# Patient Record
Sex: Male | Born: 1955
Health system: Southern US, Community
[De-identification: ages and names within clinical notes are randomized; demographics above are authoritative.]

## PROBLEM LIST (undated history)

## (undated) DIAGNOSIS — E785 Hyperlipidemia, unspecified: Secondary | ICD-10-CM

## (undated) DIAGNOSIS — K922 Gastrointestinal hemorrhage, unspecified: Secondary | ICD-10-CM

## (undated) DIAGNOSIS — I214 Non-ST elevation (NSTEMI) myocardial infarction: Secondary | ICD-10-CM

## (undated) DIAGNOSIS — D126 Benign neoplasm of colon, unspecified: Secondary | ICD-10-CM

## (undated) DIAGNOSIS — I480 Paroxysmal atrial fibrillation: Secondary | ICD-10-CM

## (undated) DIAGNOSIS — K259 Gastric ulcer, unspecified as acute or chronic, without hemorrhage or perforation: Secondary | ICD-10-CM

## (undated) DIAGNOSIS — Z951 Presence of aortocoronary bypass graft: Secondary | ICD-10-CM

## (undated) DIAGNOSIS — K219 Gastro-esophageal reflux disease without esophagitis: Secondary | ICD-10-CM

## (undated) DIAGNOSIS — T39395A Adverse effect of other nonsteroidal anti-inflammatory drugs [NSAID], initial encounter: Secondary | ICD-10-CM

## (undated) DIAGNOSIS — K317 Polyp of stomach and duodenum: Secondary | ICD-10-CM

## (undated) HISTORY — DX: Adverse effect of other nonsteroidal anti-inflammatory drugs (NSAID), initial encounter: T39.395A

## (undated) HISTORY — DX: Hyperlipidemia, unspecified: E78.5

## (undated) HISTORY — DX: Presence of aortocoronary bypass graft: Z95.1

## (undated) HISTORY — DX: Non-ST elevation (NSTEMI) myocardial infarction: I21.4

## (undated) HISTORY — DX: Gastrointestinal hemorrhage, unspecified: K92.2

## (undated) HISTORY — DX: Polyp of stomach and duodenum: K31.7

## (undated) HISTORY — DX: Benign neoplasm of colon, unspecified: D12.6

## (undated) HISTORY — DX: Paroxysmal atrial fibrillation: I48.0

## (undated) HISTORY — DX: Gastric ulcer, unspecified as acute or chronic, without hemorrhage or perforation: K25.9

## (undated) HISTORY — DX: Gastro-esophageal reflux disease without esophagitis: K21.9

---

## 2008-10-03 ENCOUNTER — Ambulatory Visit: Payer: Self-pay | Admitting: Internal Medicine

## 2008-10-03 DIAGNOSIS — Z8711 Personal history of peptic ulcer disease: Secondary | ICD-10-CM

## 2008-10-03 DIAGNOSIS — E785 Hyperlipidemia, unspecified: Secondary | ICD-10-CM | POA: Insufficient documentation

## 2008-10-03 LAB — CONVERTED CEMR LAB
Albumin: 3.9 g/dL (ref 3.5–5.2)
Alkaline Phosphatase: 52 units/L (ref 39–117)
BUN: 11 mg/dL (ref 6–23)
Basophils Relative: 0.6 % (ref 0.0–3.0)
Eosinophils Relative: 14.8 % — ABNORMAL HIGH (ref 0.0–5.0)
GFR calc Af Amer: 115 mL/min
Glucose, Bld: 112 mg/dL — ABNORMAL HIGH (ref 70–99)
HCT: 44.2 % (ref 39.0–52.0)
Hemoglobin: 15.5 g/dL (ref 13.0–17.0)
Monocytes Absolute: 0.6 10*3/uL (ref 0.1–1.0)
Monocytes Relative: 9.2 % (ref 3.0–12.0)
Neutro Abs: 3.3 10*3/uL (ref 1.4–7.7)
Nitrite: NEGATIVE
Platelets: 154 10*3/uL (ref 150–400)
Potassium: 5 meq/L (ref 3.5–5.1)
RBC: 4.9 M/uL (ref 4.22–5.81)
Specific Gravity, Urine: 1.015 (ref 1.000–1.03)
TSH: 0.99 microintl units/mL (ref 0.35–5.50)
Total Protein, Urine: NEGATIVE mg/dL
Total Protein: 6.9 g/dL (ref 6.0–8.3)
WBC: 6.7 10*3/uL (ref 4.5–10.5)
pH: 6 (ref 5.0–8.0)

## 2008-10-12 ENCOUNTER — Ambulatory Visit: Payer: Self-pay

## 2008-10-12 ENCOUNTER — Encounter: Payer: Self-pay | Admitting: Internal Medicine

## 2008-10-20 ENCOUNTER — Encounter: Payer: Self-pay | Admitting: Internal Medicine

## 2009-01-18 ENCOUNTER — Ambulatory Visit: Payer: Self-pay | Admitting: Gastroenterology

## 2009-02-02 ENCOUNTER — Ambulatory Visit: Payer: Self-pay | Admitting: Gastroenterology

## 2009-02-02 ENCOUNTER — Encounter: Payer: Self-pay | Admitting: Gastroenterology

## 2009-02-02 ENCOUNTER — Encounter: Payer: Self-pay | Admitting: Internal Medicine

## 2009-02-05 ENCOUNTER — Encounter: Payer: Self-pay | Admitting: Gastroenterology

## 2013-12-16 ENCOUNTER — Encounter: Payer: Self-pay | Admitting: Gastroenterology

## 2014-08-19 ENCOUNTER — Encounter: Payer: Self-pay | Admitting: Gastroenterology

## 2014-08-23 ENCOUNTER — Telehealth: Payer: Self-pay | Admitting: Gastroenterology

## 2014-08-23 ENCOUNTER — Inpatient Hospital Stay (HOSPITAL_COMMUNITY)
Admission: EM | Admit: 2014-08-23 | Discharge: 2014-08-25 | DRG: 378 | Disposition: A | Discharge status: Home / Self Care | Payer: BC Managed Care – PPO | Attending: Internal Medicine | Admitting: Internal Medicine

## 2014-08-23 ENCOUNTER — Encounter (HOSPITAL_COMMUNITY): Payer: Self-pay | Admitting: Emergency Medicine

## 2014-08-23 ENCOUNTER — Emergency Department (HOSPITAL_COMMUNITY): Payer: BC Managed Care – PPO

## 2014-08-23 DIAGNOSIS — T39395A Adverse effect of other nonsteroidal anti-inflammatory drugs [NSAID], initial encounter: Secondary | ICD-10-CM | POA: Diagnosis present

## 2014-08-23 DIAGNOSIS — E785 Hyperlipidemia, unspecified: Secondary | ICD-10-CM | POA: Diagnosis present

## 2014-08-23 DIAGNOSIS — K2210 Ulcer of esophagus without bleeding: Secondary | ICD-10-CM | POA: Diagnosis present

## 2014-08-23 DIAGNOSIS — K317 Polyp of stomach and duodenum: Secondary | ICD-10-CM | POA: Diagnosis present

## 2014-08-23 DIAGNOSIS — D62 Acute posthemorrhagic anemia: Secondary | ICD-10-CM | POA: Diagnosis present

## 2014-08-23 DIAGNOSIS — K209 Esophagitis, unspecified without bleeding: Secondary | ICD-10-CM

## 2014-08-23 DIAGNOSIS — D72829 Elevated white blood cell count, unspecified: Secondary | ICD-10-CM | POA: Diagnosis present

## 2014-08-23 DIAGNOSIS — Z8711 Personal history of peptic ulcer disease: Secondary | ICD-10-CM

## 2014-08-23 DIAGNOSIS — K257 Chronic gastric ulcer without hemorrhage or perforation: Secondary | ICD-10-CM | POA: Diagnosis present

## 2014-08-23 DIAGNOSIS — Z98818 Other dental procedure status: Secondary | ICD-10-CM | POA: Diagnosis not present

## 2014-08-23 DIAGNOSIS — D500 Iron deficiency anemia secondary to blood loss (chronic): Secondary | ICD-10-CM

## 2014-08-23 DIAGNOSIS — T398X5A Adverse effect of other nonopioid analgesics and antipyretics, not elsewhere classified, initial encounter: Secondary | ICD-10-CM

## 2014-08-23 DIAGNOSIS — K260 Acute duodenal ulcer with hemorrhage: Principal | ICD-10-CM | POA: Diagnosis present

## 2014-08-23 DIAGNOSIS — T3995XA Adverse effect of unspecified nonopioid analgesic, antipyretic and antirheumatic, initial encounter: Secondary | ICD-10-CM

## 2014-08-23 DIAGNOSIS — K922 Gastrointestinal hemorrhage, unspecified: Secondary | ICD-10-CM | POA: Diagnosis present

## 2014-08-23 DIAGNOSIS — K264 Chronic or unspecified duodenal ulcer with hemorrhage: Secondary | ICD-10-CM | POA: Diagnosis present

## 2014-08-23 DIAGNOSIS — K253 Acute gastric ulcer without hemorrhage or perforation: Secondary | ICD-10-CM

## 2014-08-23 DIAGNOSIS — Z888 Allergy status to other drugs, medicaments and biological substances status: Secondary | ICD-10-CM

## 2014-08-23 DIAGNOSIS — K259 Gastric ulcer, unspecified as acute or chronic, without hemorrhage or perforation: Secondary | ICD-10-CM | POA: Diagnosis present

## 2014-08-23 HISTORY — DX: Gastric ulcer, unspecified as acute or chronic, without hemorrhage or perforation: K25.9

## 2014-08-23 LAB — COMPREHENSIVE METABOLIC PANEL
ALK PHOS: 40 U/L (ref 39–117)
ALT: 11 U/L (ref 0–53)
ANION GAP: 13 (ref 5–15)
AST: 13 U/L (ref 0–37)
Albumin: 3.4 g/dL — ABNORMAL LOW (ref 3.5–5.2)
BUN: 53 mg/dL — AB (ref 6–23)
CALCIUM: 8.5 mg/dL (ref 8.4–10.5)
CO2: 22 meq/L (ref 19–32)
Chloride: 103 mEq/L (ref 96–112)
Creatinine, Ser: 1.31 mg/dL (ref 0.50–1.35)
GFR, EST AFRICAN AMERICAN: 69 mL/min — AB (ref >90)
GFR, EST NON AFRICAN AMERICAN: 59 mL/min — AB (ref >90)
GLUCOSE: 137 mg/dL — AB (ref 70–99)
Potassium: 4.7 mEq/L (ref 3.7–5.3)
Sodium: 138 mEq/L (ref 137–147)
Total Bilirubin: 0.7 mg/dL (ref 0.3–1.2)
Total Protein: 6.1 g/dL (ref 6.0–8.3)

## 2014-08-23 LAB — MRSA PCR SCREENING: MRSA BY PCR: NEGATIVE

## 2014-08-23 LAB — PROTIME-INR
INR: 1.12 (ref 0.00–1.49)
Prothrombin Time: 14.4 seconds (ref 11.6–15.2)

## 2014-08-23 LAB — APTT: APTT: 25 s (ref 24–37)

## 2014-08-23 LAB — HEMOGLOBIN AND HEMATOCRIT, BLOOD
HCT: 26.7 % — ABNORMAL LOW (ref 39.0–52.0)
HEMOGLOBIN: 9.3 g/dL — AB (ref 13.0–17.0)

## 2014-08-23 LAB — CBC
HEMATOCRIT: 26.4 % — AB (ref 39.0–52.0)
HEMOGLOBIN: 9.3 g/dL — AB (ref 13.0–17.0)
MCH: 30.9 pg (ref 26.0–34.0)
MCHC: 35.2 g/dL (ref 30.0–36.0)
MCV: 87.7 fL (ref 78.0–100.0)
Platelets: 197 10*3/uL (ref 150–400)
RBC: 3.01 MIL/uL — ABNORMAL LOW (ref 4.22–5.81)
RDW: 13 % (ref 11.5–15.5)
WBC: 16.9 10*3/uL — ABNORMAL HIGH (ref 4.0–10.5)

## 2014-08-23 LAB — PREPARE RBC (CROSSMATCH)

## 2014-08-23 LAB — ABO/RH: ABO/RH(D): O POS

## 2014-08-23 LAB — POC OCCULT BLOOD, ED: Fecal Occult Bld: POSITIVE — AB

## 2014-08-23 LAB — LIPASE, BLOOD: LIPASE: 20 U/L (ref 11–59)

## 2014-08-23 MED ORDER — SODIUM CHLORIDE 0.9 % IV SOLN
8.0000 mg/h | INTRAVENOUS | Status: DC
  Administered 2014-08-23 – 2014-08-25 (×4): 8 mg/h via INTRAVENOUS
  Filled 2014-08-23 (×9): qty 80

## 2014-08-23 MED ORDER — ONDANSETRON HCL 4 MG/2ML IJ SOLN
4.0000 mg | Freq: Four times a day (QID) | INTRAMUSCULAR | Status: DC | PRN
Start: 2014-08-23 — End: 2014-08-25

## 2014-08-23 MED ORDER — SODIUM CHLORIDE 0.9 % IV SOLN
Freq: Once | INTRAVENOUS | Status: AC
  Administered 2014-08-23: 16:00:00 via INTRAVENOUS

## 2014-08-23 MED ORDER — DIPHENHYDRAMINE HCL 50 MG/ML IJ SOLN
25.0000 mg | Freq: Four times a day (QID) | INTRAMUSCULAR | Status: DC | PRN
Start: 2014-08-23 — End: 2014-08-25

## 2014-08-23 MED ORDER — MORPHINE SULFATE 2 MG/ML IJ SOLN: 2.0000 mg | INTRAMUSCULAR | Status: DC | PRN

## 2014-08-23 MED ORDER — SODIUM CHLORIDE 0.9 % IV SOLN
20.0000 mL | INTRAVENOUS | Status: DC
  Administered 2014-08-23: 20 mL via INTRAVENOUS

## 2014-08-23 MED ORDER — SODIUM CHLORIDE 0.9 % IV SOLN: Freq: Once | INTRAVENOUS | Status: AC

## 2014-08-23 MED ORDER — SODIUM CHLORIDE 0.9 % IV SOLN: INTRAVENOUS | Status: DC

## 2014-08-23 MED ORDER — GUAIFENESIN-DM 100-10 MG/5ML PO SYRP: 5.0000 mL | ORAL_SOLUTION | ORAL | Status: DC | PRN

## 2014-08-23 MED ORDER — ONDANSETRON HCL 4 MG PO TABS: 4.0000 mg | ORAL_TABLET | Freq: Four times a day (QID) | ORAL | Status: DC | PRN

## 2014-08-23 MED ORDER — SODIUM CHLORIDE 0.9 % IV SOLN
INTRAVENOUS | Status: DC
  Administered 2014-08-24: 250 mL via INTRAVENOUS

## 2014-08-23 MED ORDER — SODIUM CHLORIDE 0.9 % IJ SOLN
3.0000 mL | Freq: Two times a day (BID) | INTRAMUSCULAR | Status: DC
  Administered 2014-08-24 – 2014-08-25 (×2): 3 mL via INTRAVENOUS

## 2014-08-23 MED ORDER — PANTOPRAZOLE SODIUM 40 MG IV SOLR
80.0000 mg | Freq: Once | INTRAVENOUS | Status: AC
  Administered 2014-08-23: 80 mg via INTRAVENOUS
  Filled 2014-08-23 (×2): qty 80

## 2014-08-23 NOTE — H&P (Signed)
Patient Demographics  [name], is a 58 y.o. male  MRN: [id number]   DOB - February 15, 1956  Admit Date - 08/23/2014  Outpatient Primary MD for the patient is Norberto Sorenson T. Fuller Plan, MD   With History of -  History reviewed. No pertinent past medical history.    History reviewed. No pertinent past surgical history.  in for   Chief Complaint  Patient presents with  . GI Bleeding     HPI  [name]  is a 58 y.o. male, with history of NSAID induced gastric ulcer in the past, dyslipidemia recently had a tooth extraction 4 days ago and then was placed on scheduled NSAID comes in with two to 3 day history of epigastric pain, nausea vomiting with blood in vomitus, melena followed by bright red blood per stool. He started to feel quite weak and dizzy, went to urgent care from where he was sent to ER and diagnosed with acute upper GI bleed with blood loss related anemia. Patient denies any headache, no chest pain palpitations, no shortness of breath, no weight loss, no focal weakness.  He is currently receiving transfusion and feeling better, hemodynamically stable now, he was initially orthostatic, [facility] was consulted who requested hospitalist admission.    Review of Systems    In addition to the HPI above,  No Fever-chills, No Headache, No changes with Vision or hearing, No problems swallowing food or Liquids, No Chest pain, Cough or Shortness of Breath, Positive epigastric pain with blood in stool and vomitus No Blood in  Urine, No dysuria, No new skin rashes or bruises, No new joints pains-aches,  No new weakness, tingling, numbness in any extremity, generalized weakness No recent weight gain or loss, No polyuria, polydypsia or polyphagia, No significant Mental Stressors.  A full 10 point Review of  Systems was done, except as stated above, all other Review of Systems were negative.   Social History History  Substance Use Topics  . Smoking status: Never Smoker   . Smokeless tobacco: Never Used  . Alcohol Use: No      Family History No gastric ulcers in family  Prior to Admission medications   Medication Sig Start Date End Date Taking? Authorizing Provider  ibuprofen (ADVIL,MOTRIN) 200 MG tablet Take 800 mg by mouth every 4 (four) hours as needed (root canal pain).   Yes Historical Provider, MD  Misc Natural Products (NF FORMULAS TESTOSTERONE PO) Take 1 capsule by mouth daily.   Yes Historical Provider, MD  Omega-3 Fatty Acids (OMEGA 3 PO) Take 2 capsules by mouth daily.   Yes Historical Provider, MD  ranitidine (ZANTAC) 150 MG tablet Take 300-450 mg by mouth at bedtime.   Yes Historical Provider, MD    Allergies  Allergen Reactions  . Ibuprofen Other (See Comments)    When taking high doses     Physical Exam  Vitals  Blood pressure 131/75, pulse 100, temperature 97.7 F (36.5 C), temperature source  Oral, resp. rate 19, SpO2 100.00%.   1. Genera middle aged white male lying in bed in NAD,     2. Normal affect and insight, Not Suicidal or Homicidal, Awake Alert, Oriented X 3.  3. No F.N deficits, ALL C.Nerves Intact, Strength 5/5 all 4 extremities, Sensation intact all 4 extremities, Plantars down going.  4. Ears and Eyes appear Normal, Conjunctivae clear, PERRLA. Moist Oral Mucosa.  5. Supple Neck, No JVD, No cervical lymphadenopathy appriciated, No Carotid Bruits.  6. Symmetrical Chest wall movement, Good air movement bilaterally, CTAB.  7. RRR, No Gallops, Rubs or Murmurs, No Parasternal Heave.  8. Positive Bowel Sounds, Abdomen Soft, mild epigastric tenderness, No organomegaly appriciated,No rebound -guarding or rigidity.  9.  No Cyanosis, Normal Skin Turgor, No Skin Rash or Bruise.  10. Good muscle tone,  joints appear normal , no effusions, Normal  ROM.  11. No Palpable Lymph Nodes in Neck or Axillae     Data Review  CBC  Recent Labs Lab 08/23/14 1333  WBC 16.9*  HGB 9.3*  HCT 26.4*  PLT 197  MCV 87.7  MCH 30.9  MCHC 35.2  RDW 13.0   ------------------------------------------------------------------------------------------------------------------  Chemistries   Recent Labs Lab 08/23/14 1333  NA 138  K 4.7  CL 103  CO2 22  GLUCOSE 137*  BUN 53*  CREATININE 1.31  CALCIUM 8.5  AST 13  ALT 11  ALKPHOS 40  BILITOT 0.7   ------------------------------------------------------------------------------------------------------------------ CrCl is unknown because both a height and weight (above a minimum accepted value) are required for this calculation. ------------------------------------------------------------------------------------------------------------------ No results found for this basename: TSH, T4TOTAL, FREET3, T3FREE, THYROIDAB,  in the last 72 hours   Coagulation profile  Recent Labs Lab 08/23/14 1351  INR 1.12   ------------------------------------------------------------------------------------------------------------------- No results found for this basename: DDIMER,  in the last 72 hours -------------------------------------------------------------------------------------------------------------------  Cardiac Enzymes No results found for this basename: CK, CKMB, TROPONINI, MYOGLOBIN,  in the last 168 hours ------------------------------------------------------------------------------------------------------------------ No components found with this basename: POCBNP,    ---------------------------------------------------------------------------------------------------------------  Urinalysis    Component Value Date/Time   COLORURINE LT YELLOW 10/03/2008 0926   APPEARANCEUR Clear 10/03/2008 0926   LABSPEC 1.015 10/03/2008 0926   PHURINE 6.0 10/03/2008 0926   GLUCOSEU NEGATIVE  10/03/2008 0926   BILIRUBINUR NEGATIVE 10/03/2008 0926   KETONESUR NEGATIVE 10/03/2008 0926   UROBILINOGEN 0.2 mg/dL 10/03/2008 0926   NITRITE Negative 10/03/2008 0926   LEUKOCYTESUR Negative 10/03/2008 0926    ----------------------------------------------------------------------------------------------------------------  Imaging results:   Dg Abd Portable 1v  08/23/2014   CLINICAL DATA:  Nasogastric tube placement.  EXAM: PORTABLE ABDOMEN - 1 VIEW  COMPARISON:  None.  FINDINGS: Nasogastric terminates at the body of the stomach. Non-obstructive bowel gas pattern. Probable phleboliths in the left hemipelvis.  IMPRESSION: nasogastric tube terminating at the body of the stomach.   Electronically Signed   By: Abigail Miyamoto M.D.   On: 08/23/2014 15:53         Assessment & Plan    1. NSAID-induced upper GI bleed likely due to peptic ulcer disease versus gastritis. Will be admitted to step down, 2 units of transfusion with 2 units on hold all times, monitor H&H closely, IV PPI and bolus and drip, GI [facility] has been consulted. Counseled not to use NSAIDs in the future.    2. Dyslipidemia. Outpatient followup.    3. Leukocytosis. Reactive versus hemoconcentration. Monitor.     DVT Prophylaxis  SCDs   AM Labs Ordered, also please review Full Orders  Family Communication: Admission, patients condition and plan of care including tests being ordered have been discussed with the patient and wife who indicate understanding and agree with the plan and Code Status.  Code Status full  Likely DC to home  Condition GUARDED   Time spent in minutes : 35    SINGH,PRASHANT K M.D on 08/23/2014 at 4:47 PM  Between 7am to 7pm - Pager - [phone]  After 7pm go to [email or web address] - password [email or web address]  And look for the night coverage person covering me after hours  [facility] Office  [phone]   **Disclaimer: This note may have been dictated with voice recognition  software. Similar sounding words can inadvertently be transcribed and this note may contain transcription errors which may not have been corrected upon publication of note.**

## 2014-08-23 NOTE — Consult Note (Signed)
Referring Provider: No ref. provider found Primary Care Physician:  Dr. Linda Hedges Primary Gastroenterologist:  Dr. Fuller Plan  Reason for Consultation:  UGIB   HPI: [name] is a 58 y.o. male who is known to Dr. Fuller Plan for screening colonoscopy in 01/2009 at which time he was found to have one polyp removed that was an adenomatous polyp.  His PMH is limited, but does have a history of clinically diagnosed bleeding peptic ulcer in 2009 after taking Naproxen (by PCP).  He was treated with medication only and no endoscopy performed.  He presented to [facility] today with complaints of feeling dizzy after having several days of black to bloody stools.  He says that last Wednesday he had a root canal and started taking 4 advil every 4 hours for 3 days.  On Saturday he started having abdominal pains and then started having black stools.  This continued and then the past couple of days the stools have been more maroon/dark red in color.  He had 3 BM's today, but none since coming to the ED.  He had some vomiting yesterday with some blood, but no vomiting since 3 AM.  NGT was placed in the ED and lavage was clear with just small flecks of blood.  BUN is elevated at 53.  Hgb is 9.3 grams; there is no recent labs for comparison but Hgb from 5 years ago was 15.5 grams.  He is getting one unit of blood and PPI bolus was given and gtt was started.  He was initially orthostatic but is now hemodynamically stable.   History reviewed. No pertinent past medical history.  History reviewed. No pertinent past surgical history.  Prior to Admission medications   Medication Sig Start Date End Date Taking? Authorizing Provider  ibuprofen (ADVIL,MOTRIN) 200 MG tablet Take 800 mg by mouth every 4 (four) hours as needed (root canal pain).   Yes Historical Provider, MD  Misc Natural Products (NF FORMULAS TESTOSTERONE PO) Take 1 capsule by mouth daily.   Yes Historical Provider, MD  Omega-3 Fatty Acids (OMEGA 3 PO) Take 2 capsules by  mouth daily.   Yes Historical Provider, MD  ranitidine (ZANTAC) 150 MG tablet Take 300-450 mg by mouth at bedtime.   Yes Historical Provider, MD    Current Facility-Administered Medications  Medication Dose Route Frequency Provider Last Rate Last Dose  . 0.9 %  sodium chloride infusion  20 mL Intravenous Continuous Charlesetta Shanks, MD 75 mL/hr at 08/23/14 1406 20 mL at 08/23/14 1406  . diphenhydrAMINE (BENADRYL) injection 25 mg  25 mg Intravenous Q6H PRN Thurnell Lose, MD      . pantoprazole (PROTONIX) 80 mg in sodium chloride 0.9 % 250 mL (0.32 mg/mL) infusion  8 mg/hr Intravenous Continuous Charlesetta Shanks, MD 25 mL/hr at 08/23/14 1431 8 mg/hr at 08/23/14 1431   Current Outpatient Prescriptions  Medication Sig Dispense Refill  . ibuprofen (ADVIL,MOTRIN) 200 MG tablet Take 800 mg by mouth every 4 (four) hours as needed (root canal pain).      . Misc Natural Products (NF FORMULAS TESTOSTERONE PO) Take 1 capsule by mouth daily.      . Omega-3 Fatty Acids (OMEGA 3 PO) Take 2 capsules by mouth daily.      . ranitidine (ZANTAC) 150 MG tablet Take 300-450 mg by mouth at bedtime.        Allergies as of 08/23/2014 - Review Complete 08/23/2014  Allergen Reaction Noted  . Ibuprofen Other (See Comments) 08/23/2014    History  reviewed. No pertinent family history.  History   Social History  . Marital Status: Married    Spouse Name: N/A    Number of Children: N/A  . Years of Education: N/A   Occupational History  . Not on file.   Social History Main Topics  . Smoking status: Never Smoker   . Smokeless tobacco: Never Used  . Alcohol Use: No  . Drug Use: No  . Sexual Activity: Yes   Other Topics Concern  . Not on file   Social History Narrative  . No narrative on file    Review of Systems: Ten point ROS is O/W negative except as mentioned in HPI.  Physical Exam: Vital signs in last 24 hours: Temp:  [97.9 F (36.6 C)-98.2 F (36.8 C)] 98.2 F (36.8 C) (09/30 1601) Pulse  Rate:  [96-115] 97 (09/30 1601) Resp:  [18-19] 19 (09/30 1601) BP: (76-144)/(54-81) 131/80 mmHg (09/30 1601) SpO2:  [96 %-100 %] 100 % (09/30 1601)   General:  Alert, Well-developed, well-nourished, pleasant and cooperative in NAD Head:  Normocephalic and atraumatic. Eyes:  Sclera clear, no icterus.  Conjunctiva pink. Ears:  Normal auditory acuity. Mouth:  No deformity or lesions.   Lungs:  Clear throughout to auscultation.  No wheezes, crackles, or rhonchi.  Heart:  Regular rate and rhythm; no murmurs, clicks, rubs, or gallops. Abdomen:  Soft, non-distended.  BS present.  Non-tender.   Rectal:  Deferred  Msk:  Symmetrical without gross deformities. Pulses:  Normal pulses noted. Extremities:  Without clubbing or edema. Neurologic:  Alert and  oriented x4;  grossly normal neurologically. Skin:  Intact without significant lesions or rashes. Psych:  Alert and cooperative. Normal mood and affect.  Intake/Output this shift: Total I/O In: 30 [Blood:30] Out: -   Lab Results:  Recent Labs  08/23/14 1333  WBC 16.9*  HGB 9.3*  HCT 26.4*  PLT 197   BMET  Recent Labs  08/23/14 1333  NA 138  K 4.7  CL 103  CO2 22  GLUCOSE 137*  BUN 53*  CREATININE 1.31  CALCIUM 8.5   LFT  Recent Labs  08/23/14 1333  PROT 6.1  ALBUMIN 3.4*  AST 13  ALT 11  ALKPHOS 40  BILITOT 0.7   PT/INR  Recent Labs  08/23/14 1351  LABPROT 14.4  INR 1.12   Studies/Results: Dg Abd Portable 1v  08/23/2014   CLINICAL DATA:  Nasogastric tube placement.  EXAM: PORTABLE ABDOMEN - 1 VIEW  COMPARISON:  None.  FINDINGS: Nasogastric terminates at the body of the stomach. Non-obstructive bowel gas pattern. Probable phleboliths in the left hemipelvis.  IMPRESSION: nasogastric tube terminating at the body of the stomach.   Electronically Signed   By: Abigail Miyamoto M.D.   On: 08/23/2014 15:53    IMPRESSION:  -UGIB:  Likely NSAID related, rule out ulcer disease, etc.  BUN elevated at 53.  Had  clinically diagnosed ulcer with mild bleeding in 2009, which did not require hospitalization. -Blood loss anemia:  Last Hgb for comparison was 5 years ago, but Hgb only 9.3 grams today.  He is receiving one unit of PRBC's. -NSAID use:  Recent use after root canal surgery.  PLAN: -Continue PPI gtt. -Monitor Hgb and transfuse further prn. -EGD in AM. -Will need to avoid NSAID's.   Dhairya Corales D.  08/23/2014, 4:20 PM  Pager number [phone]

## 2014-08-23 NOTE — ED Notes (Signed)
Confirmed with MD Pfeiffer regarding blood product administration and NG tube placement. Patient Hgb 9.3. Dizziness upon standing. No other associated symptoms. No current vomiting/diarrhea/nausea noted. Advised to give one unit PRBCs and place NG tube. Family aware of plan of care.

## 2014-08-23 NOTE — Telephone Encounter (Signed)
Patient called back and the urgent care sent him to the ER

## 2014-08-23 NOTE — ED Notes (Signed)
Pt states that he had root canal last Monday.  Pt states that he has been on ibuprofen around the clock.  States that on Sunday, began having tarry stools that have progressed to bright red today.  Vomiting coffee ground emesis 2 nights ago. Feels weak.  Appears pale.

## 2014-08-23 NOTE — Progress Notes (Signed)
Notified on call physician at [facility] of 9.3 hbg post 1 unit of PRBC's. Instructed not to tranfuse 2nd unit. VSS, will continue to monitor patient.

## 2014-08-23 NOTE — ED Provider Notes (Signed)
CSN: [id number]     Arrival date & time 08/23/14  1310 History   First MD Initiated Contact with Patient 08/23/14 1336     Chief Complaint  Patient presents with  . GI Bleeding     (Consider location/radiation/quality/duration/timing/severity/associated sxs/prior Treatment) HPI The patient poor she has been taking ibuprofen nearly every 4 hours since he had a root canal 9 days ago. He reports that he is having bleeding from his stomach which is due to the ibuprofen. He reports on Sunday, 4 days ago he started having a black looking stool. As of yesterday he had approximately 6-7 bloody-looking stools. And 2 nights ago he vomited coffee grounds looking material. Yesterday evening he vomited material that looked more red to him. He has not been able to eat and has begun to feel dizzy and lightheaded. He has not actually passed out but he has started to feel as if he might. Patient has not had other associated constitutional illness.  History reviewed. No pertinent past medical history. History reviewed. No pertinent past surgical history. History reviewed. No pertinent family history. History  Substance Use Topics  . Smoking status: Never Smoker   . Smokeless tobacco: Never Used  . Alcohol Use: No    Review of Systems 10 Systems reviewed and are negative for acute change except as noted in the HPI.    Allergies  Ibuprofen  Home Medications   Prior to Admission medications   Medication Sig Start Date End Date Taking? Authorizing Provider  ibuprofen (ADVIL,MOTRIN) 200 MG tablet Take 800 mg by mouth every 4 (four) hours as needed (root canal pain).   Yes Historical Provider, MD  Misc Natural Products (NF FORMULAS TESTOSTERONE PO) Take 1 capsule by mouth daily.   Yes Historical Provider, MD  Omega-3 Fatty Acids (OMEGA 3 PO) Take 2 capsules by mouth daily.   Yes Historical Provider, MD  ranitidine (ZANTAC) 150 MG tablet Take 300-450 mg by mouth at bedtime.   Yes Historical Provider, MD    BP 112/70  Pulse 101  Temp(Src) 98 F (36.7 C) (Oral)  Resp 18  SpO2 100% Physical Exam  Constitutional: He is oriented to person, place, and time. He appears well-developed and well-nourished.  Patient is pale appearance however he is up and ambulatory the room as I walk in, his mental status is clear he has no respiratory distress.  HENT:  Head: Normocephalic and atraumatic.  Eyes: EOM are normal.  Cardiovascular: Normal rate, regular rhythm and normal heart sounds.   Pulmonary/Chest: Effort normal and breath sounds normal.  Abdominal: Soft. There is tenderness (patient has mild epigastric tenderness without guarding or rebound).  Genitourinary:  Rectal examination shows burgundy red blood in the rectal vault.  Musculoskeletal: Normal range of motion. He exhibits no edema and no tenderness.  Neurological: He is alert and oriented to person, place, and time. He has normal reflexes.  Skin: Skin is warm and dry. There is pallor.  Psychiatric: He has a normal mood and affect.     ED Course  Procedures (including critical care time) Labs Review Labs Reviewed  CBC - Abnormal; Notable for the following:    WBC 16.9 (*)    RBC 3.01 (*)    Hemoglobin 9.3 (*)    HCT 26.4 (*)    All other components within normal limits  COMPREHENSIVE METABOLIC PANEL - Abnormal; Notable for the following:    Glucose, Bld 137 (*)    BUN 53 (*)    Albumin 3.4 (*)  GFR calc non Af Amer 59 (*)    GFR calc Af Amer 69 (*)    All other components within normal limits  POC OCCULT BLOOD, ED - Abnormal; Notable for the following:    Fecal Occult Bld POSITIVE (*)    All other components within normal limits  APTT  PROTIME-INR  LIPASE, BLOOD  POC OCCULT BLOOD, ED  TYPE AND SCREEN  ABO/RH  PREPARE RBC (CROSSMATCH)  TYPE AND SCREEN  PREPARE RBC (CROSSMATCH)    Imaging Review No results found.   EKG Interpretation None     1535: [facility] will come to the emergency department to assess  the patient. CRITICAL CARE Performed by: Charlesetta Shanks   Total critical care time: 30  Critical care time was exclusive of separately billable procedures and treating other patients.  Critical care was necessary to treat or prevent imminent or life-threatening deterioration.  Critical care was time spent personally by me on the following activities: development of treatment plan with patient and/or surrogate as well as nursing, discussions with consultants, evaluation of patient's response to treatment, examination of patient, obtaining history from patient or surrogate, ordering and performing treatments and interventions, ordering and review of laboratory studies, ordering and review of radiographic studies, pulse oximetry and re-evaluation of patient's condition. MDM   Final diagnoses:  GI bleed due to NSAIDs  Anemia due to blood loss   The patient presents as outlined above with a history for upper GI bleed starting an estimated 4 days ago. The patient is orthostatic with systolic blood pressure dropping to 76/54 with standing. At this point the patient is at significant risk for significant blood loss in association with his GI bleed as well as being significantly hemoconcentrated from poor po intake, with a white count of 16 and hemoglobin of 9 which I suspect with rehydration will drop significantly as he equilibrates from both his volume depletion as well as blood loss.     Charlesetta Shanks, MD 08/23/14 [phone]

## 2014-08-23 NOTE — Consult Note (Signed)
Patient seen, examined, and I agree with the above documentation, including the assessment and plan. UGI bleeding, likely secondary to gastric or duodenal ulcer in the setting of NSAID use.  He has had epigastric pain, though mild, over the last 6-8 months (perhaps gastritis now worsened with recent ibuprofen) Resuscitation with IVFs, 1 u pRBC, PPI gtt Serial Hgb, 2 large bore PIVs Last BM was multiple hours ago (1 today) and no hematemesis since yesterday which was scant Plan EGD in the am.   The nature of the procedure, as well as the risks, benefits, and alternatives were carefully and thoroughly reviewed with the patient. Ample time for discussion and questions allowed. The patient understood, was satisfied, and agreed to proceed.

## 2014-08-24 ENCOUNTER — Encounter (HOSPITAL_COMMUNITY): Payer: Self-pay | Admitting: *Deleted

## 2014-08-24 ENCOUNTER — Encounter (HOSPITAL_COMMUNITY)
Admission: EM | Disposition: A | Discharge status: Home / Self Care | Payer: Self-pay | Source: Home / Self Care | Attending: Internal Medicine

## 2014-08-24 DIAGNOSIS — K259 Gastric ulcer, unspecified as acute or chronic, without hemorrhage or perforation: Secondary | ICD-10-CM | POA: Diagnosis present

## 2014-08-24 DIAGNOSIS — K209 Esophagitis, unspecified without bleeding: Secondary | ICD-10-CM | POA: Diagnosis present

## 2014-08-24 DIAGNOSIS — K264 Chronic or unspecified duodenal ulcer with hemorrhage: Secondary | ICD-10-CM | POA: Diagnosis present

## 2014-08-24 DIAGNOSIS — T39395A Adverse effect of other nonsteroidal anti-inflammatory drugs [NSAID], initial encounter: Secondary | ICD-10-CM

## 2014-08-24 DIAGNOSIS — K922 Gastrointestinal hemorrhage, unspecified: Secondary | ICD-10-CM

## 2014-08-24 DIAGNOSIS — K253 Acute gastric ulcer without hemorrhage or perforation: Secondary | ICD-10-CM

## 2014-08-24 DIAGNOSIS — D500 Iron deficiency anemia secondary to blood loss (chronic): Secondary | ICD-10-CM

## 2014-08-24 HISTORY — PX: ESOPHAGOGASTRODUODENOSCOPY: SHX5428

## 2014-08-24 LAB — CBC
HCT: 23.6 % — ABNORMAL LOW (ref 39.0–52.0)
HEMATOCRIT: 23.3 % — AB (ref 39.0–52.0)
Hemoglobin: 8.2 g/dL — ABNORMAL LOW (ref 13.0–17.0)
Hemoglobin: 8 g/dL — ABNORMAL LOW (ref 13.0–17.0)
MCH: 30.7 pg (ref 26.0–34.0)
MCH: 30.3 pg (ref 26.0–34.0)
MCHC: 34.7 g/dL (ref 30.0–36.0)
MCHC: 34.3 g/dL (ref 30.0–36.0)
MCV: 88.4 fL (ref 78.0–100.0)
MCV: 88.3 fL (ref 78.0–100.0)
PLATELETS: 129 10*3/uL — AB (ref 150–400)
Platelets: 153 10*3/uL (ref 150–400)
RBC: 2.67 MIL/uL — AB (ref 4.22–5.81)
RBC: 2.64 MIL/uL — AB (ref 4.22–5.81)
RDW: 13.6 % (ref 11.5–15.5)
RDW: 13.6 % (ref 11.5–15.5)
WBC: 12.2 10*3/uL — ABNORMAL HIGH (ref 4.0–10.5)
WBC: 14.2 10*3/uL — ABNORMAL HIGH (ref 4.0–10.5)

## 2014-08-24 LAB — BASIC METABOLIC PANEL
Anion gap: 10 (ref 5–15)
BUN: 35 mg/dL — ABNORMAL HIGH (ref 6–23)
CO2: 23 mEq/L (ref 19–32)
Calcium: 7.8 mg/dL — ABNORMAL LOW (ref 8.4–10.5)
Chloride: 107 mEq/L (ref 96–112)
Creatinine, Ser: 0.96 mg/dL (ref 0.50–1.35)
Glucose, Bld: 113 mg/dL — ABNORMAL HIGH (ref 70–99)
POTASSIUM: 4.2 meq/L (ref 3.7–5.3)
SODIUM: 140 meq/L (ref 137–147)

## 2014-08-24 LAB — HEMOGLOBIN AND HEMATOCRIT, BLOOD
HCT: 23 % — ABNORMAL LOW (ref 39.0–52.0)
Hemoglobin: 8 g/dL — ABNORMAL LOW (ref 13.0–17.0)

## 2014-08-24 SURGERY — EGD (ESOPHAGOGASTRODUODENOSCOPY)
Anesthesia: Moderate Sedation

## 2014-08-24 MED ORDER — MIDAZOLAM HCL 10 MG/2ML IJ SOLN
INTRAMUSCULAR | Status: AC
Start: 2014-08-24 — End: 2014-08-24
  Filled 2014-08-24: qty 4

## 2014-08-24 MED ORDER — MIDAZOLAM HCL 10 MG/2ML IJ SOLN
INTRAMUSCULAR | Status: DC | PRN
  Administered 2014-08-24: 1 mg via INTRAVENOUS
  Administered 2014-08-24 (×2): 2.5 mg via INTRAVENOUS

## 2014-08-24 MED ORDER — SUCRALFATE 1 GM/10ML PO SUSP
1.0000 g | Freq: Three times a day (TID) | ORAL | Status: DC
  Administered 2014-08-24 – 2014-08-25 (×4): 1 g via ORAL
  Filled 2014-08-24 (×8): qty 10

## 2014-08-24 MED ORDER — FENTANYL CITRATE 0.05 MG/ML IJ SOLN
INTRAMUSCULAR | Status: DC | PRN
  Administered 2014-08-24 (×3): 25 ug via INTRAVENOUS

## 2014-08-24 MED ORDER — DIPHENHYDRAMINE HCL 50 MG/ML IJ SOLN
INTRAMUSCULAR | Status: AC
  Filled 2014-08-24: qty 1

## 2014-08-24 MED ORDER — EPINEPHRINE HCL 0.1 MG/ML IJ SOSY
PREFILLED_SYRINGE | INTRAMUSCULAR | Status: AC
  Filled 2014-08-24: qty 10

## 2014-08-24 MED ORDER — SODIUM CHLORIDE 0.9 % IJ SOLN
INTRAMUSCULAR | Status: DC | PRN
  Administered 2014-08-24: 09:00:00

## 2014-08-24 MED ORDER — FENTANYL CITRATE 0.05 MG/ML IJ SOLN
INTRAMUSCULAR | Status: AC
  Filled 2014-08-24: qty 2

## 2014-08-24 MED ORDER — SODIUM CHLORIDE 0.9 % IV BOLUS (SEPSIS): 1000.0000 mL | INTRAVENOUS | Status: DC | PRN

## 2014-08-24 MED ORDER — BUTAMBEN-TETRACAINE-BENZOCAINE 2-2-14 % EX AERO
INHALATION_SPRAY | CUTANEOUS | Status: DC | PRN
  Administered 2014-08-24: 2 via TOPICAL

## 2014-08-24 NOTE — Op Note (Signed)
[facility] [place], [address]   ENDOSCOPY PROCEDURE REPORT  PATIENT: [name], [name]  MR#: [id number] BIRTHDATE: [birth year] , 77  yrs. old GENDER: male ENDOSCOPIST: Jerene Bears, MD REFERRED BY:  [facility] PROCEDURE DATE:  08/24/2014 PROCEDURE:  EGD w/ control of bleeding and EGD w/ directed submucosal injection(s), any substance ASA CLASS:     Class III INDICATIONS:  hematemesis, melena, and acute post hemorrhagic anemia. MEDICATIONS: Fentanyl 75 mcg IV and Versed 6 mg IV TOPICAL ANESTHETIC: Cetacaine Spray  DESCRIPTION OF PROCEDURE: After the risks benefits and alternatives of the procedure were thoroughly explained, informed consent was obtained.  The Pentax Gastroscope [id number] endoscope was introduced through the mouth and advanced to the second portion of the duodenum , Without limitations.  The instrument was slowly withdrawn as the mucosa was fully examined.   ESOPHAGUS: There was severe, ulcerative, LA Class D, esophagitis (presumed reflux related) noted in the mid and distal esophagus spanning 10-15 cm to the GE junction.  STOMACH: Four erosions were found in the gastric antrum, without other significant gastritis. No evidence of bleeding from the stomach. Benign, fundic gland-appearing, gastric polyps noted in the cardia and fundus.  DUODENUM: A single ulcer measuring 10 x 10 mm in size with a visible vessel was found in the duodenal bulb.  Submucosal injection of 28ml of epinephrine 1:10,000 was performed around the bleeding site. Bipolar (BICAP) cautery with a 7Fr probe was applied to the site for 10 secs.  Moderate pressure was applied to the cautery site with good treatment effect.  The second portion of the duodenum was unremarkable  Retroflexed views revealed no abnormalities.     The scope was then withdrawn from the patient and the procedure completed.  COMPLICATIONS: There were no immediate  complications.      ENDOSCOPIC IMPRESSION: 1.   There was severe, LA Class D, esophagitis noted 2.   Four erosions were found in the gastric antrum 3.   Benign appearing small, proximal gastric polyps 3.   Single ulcer measuring 10 x 62mm in size was found in the duodenal bulb; Submucosal injection of 16ml of epinephrine 1:10,000 was performed around the bleeding site; Bipolar (BICAP) cautery with a 7Fr probe was applied to the site for 10 secs; with good treatment effect 4.    Normal appearing 2nd part of the duodenum  RECOMMENDATIONS: 1.  Continue PPI infusion for another 24 hours 2.  Monitor hemoglobin, transfuse if necessary 3.  Check H.  pylori serum antibody, treated positive 4.  Avoid all NSAIDs 5.  Continue twice-daily PPI at discharge for at least 8 weeks. Followup with Dr.  Fuller Plan with plans to repeat endoscopy in 8-12 weeks to document healing of esophagitis and ulcer disease  eSigned:  Jerene Bears, MD 08/24/2014 9:09 AM    CC:The Patient and Lucio Edward, MD  CPT CODES:     43255@Upper  gastrointestinal endoscopy including esophagus, stomach, and either the duodenum and/or jejunum as appropriate; with control of bleeding, any method 43236@Upper  gastrointestinal endoscopy including esophagus, stomach, and either the duodenum and/or jejunum as appropriate; with directed submucosal injection(s), any substance ICD CODES:  The ICD and CPT codes recommended by this software are interpretations from the data that the clinical staff has captured with the software.  The verification of the translation of this report to the ICD and CPT codes and modifiers is the sole responsibility of the health care institution and practicing physician where this report was generated.  [employer] will not be held responsible for the validity of the ICD and CPT codes included on this report.  AMA assumes no liability for data contained or not contained herein. CPT is  a Designer, television/film set of the [employer].  PATIENT NAME:  Koen, Antilla MR#: [id number]

## 2014-08-24 NOTE — Progress Notes (Signed)
Patient Demographics  [name], is a 58 y.o. male, DOB - March 09, 1956, XNA:[id number]  Admit date - 08/23/2014   Admitting Physician Thurnell Lose, MD  Outpatient Primary MD for the patient is Pricilla Riffle. Fuller Plan, MD  LOS - 1   Chief Complaint  Patient presents with  . GI Bleeding        Subjective:   [name] today has, No headache, No chest pain, mild epigastric/LUQ abdominal pain - No Nausea, No new weakness tingling or numbness, No Cough - SOB.    Assessment & Plan     1. NSAID-induced upper GI bleed likely due to peptic ulcer disease versus gastritis. Post 2 units H&H better but may need more transfusions, monitor H&H closely, IV PPI continue, GI taking him for EGD, will monitor another 24hrs.    2. Dyslipidemia. Outpatient followup.     3. Leukocytosis. Reactive versus hemoconcentration. Improving.      Code Status: Full  Family Communication: wife  Disposition Plan: Home in am   Procedures due for EGD   Consults  LB. GI   Medications  Scheduled Meds: . sodium chloride  3 mL Intravenous Q12H   Continuous Infusions: . sodium chloride 20 mL (08/23/14 1500)  . sodium chloride    . pantoprozole (PROTONIX) infusion 8 mg/hr (08/24/14 0500)   PRN Meds:.diphenhydrAMINE, guaiFENesin-dextromethorphan, morphine injection, ondansetron (ZOFRAN) IV  DVT Prophylaxis  SCDs   Lab Results  Component Value Date   PLT 129* 08/24/2014    Antibiotics     Anti-infectives   None          Objective:   Filed Vitals:   08/24/14 0446 08/24/14 0500 08/24/14 0600 08/24/14 0748  BP:  93/60 102/63 99/52  Pulse:  86 68   Temp: 97.7 F (36.5 C)   97.7 F (36.5 C)  TempSrc: Oral   Oral  Resp:  14 1 8   Height:      Weight: 110.1 kg (242 lb 11.6 oz)       SpO2:   95% 98%    Wt Readings from Last 3 Encounters:  08/24/14 110.1 kg (242 lb 11.6 oz)  08/24/14 110.1 kg (242 lb 11.6 oz)     Intake/Output Summary (Last 24 hours) at 08/24/14 0804 Last data filed at 08/24/14 0500  Gross per 24 hour  Intake 1509.58 ml  Output   1150 ml  Net 359.58 ml     Physical Exam  Awake Alert, Oriented X 3, No new F.N deficits, Normal affect [place].AT,PERRAL Supple Neck,No JVD, No cervical lymphadenopathy appriciated.  Symmetrical Chest wall movement, Good air movement bilaterally, CTAB RRR,No Gallops,Rubs or new Murmurs, No Parasternal Heave +ve B.Sounds, Abd Soft, mild epigastric/LUQ tenderness, No organomegaly appriciated, No rebound - guarding or rigidity. No Cyanosis, Clubbing or edema, No new Rash or bruise      Data Review   Micro Results Recent Results (from the past 240 hour(s))  MRSA PCR SCREENING     Status: None   Collection Time    08/23/14  4:58 PM      Result Value Ref Range Status   MRSA by PCR NEGATIVE  NEGATIVE Final   Comment:            The  GeneXpert MRSA Assay (FDA     approved for NASAL specimens     only), is one component of a     comprehensive MRSA colonization     surveillance program. It is not     intended to diagnose MRSA     infection nor to guide or     monitor treatment for     MRSA infections.    Radiology Reports Dg Abd Portable 1v  08/23/2014   CLINICAL DATA:  Nasogastric tube placement.  EXAM: PORTABLE ABDOMEN - 1 VIEW  COMPARISON:  None.  FINDINGS: Nasogastric terminates at the body of the stomach. Non-obstructive bowel gas pattern. Probable phleboliths in the left hemipelvis.  IMPRESSION: nasogastric tube terminating at the body of the stomach.   Electronically Signed   By: Abigail Miyamoto M.D.   On: 08/23/2014 15:53     CBC  Recent Labs Lab 08/23/14 1333 08/23/14 2022 08/24/14 0335 08/24/14 0750  WBC 16.9*  --  12.2*  --   HGB 9.3* 9.3* 8.2* 8.0*  HCT 26.4* 26.7* 23.6* 23.0*  PLT 197  --   129*  --   MCV 87.7  --  88.4  --   MCH 30.9  --  30.7  --   MCHC 35.2  --  34.7  --   RDW 13.0  --  13.6  --     Chemistries   Recent Labs Lab 08/23/14 1333 08/24/14 0335  NA 138 140  K 4.7 4.2  CL 103 107  CO2 22 23  GLUCOSE 137* 113*  BUN 53* 35*  CREATININE 1.31 0.96  CALCIUM 8.5 7.8*  AST 13  --   ALT 11  --   ALKPHOS 40  --   BILITOT 0.7  --    ------------------------------------------------------------------------------------------------------------------ estimated creatinine clearance is 108.4 ml/min (by C-G formula based on Cr of 0.96). ------------------------------------------------------------------------------------------------------------------ No results found for this basename: HGBA1C,  in the last 72 hours ------------------------------------------------------------------------------------------------------------------ No results found for this basename: CHOL, HDL, LDLCALC, TRIG, CHOLHDL, LDLDIRECT,  in the last 72 hours ------------------------------------------------------------------------------------------------------------------ No results found for this basename: TSH, T4TOTAL, FREET3, T3FREE, THYROIDAB,  in the last 72 hours ------------------------------------------------------------------------------------------------------------------ No results found for this basename: VITAMINB12, FOLATE, FERRITIN, TIBC, IRON, RETICCTPCT,  in the last 72 hours  Coagulation profile  Recent Labs Lab 08/23/14 1351  INR 1.12    No results found for this basename: DDIMER,  in the last 72 hours  Cardiac Enzymes No results found for this basename: CK, CKMB, TROPONINI, MYOGLOBIN,  in the last 168 hours ------------------------------------------------------------------------------------------------------------------ No components found with this basename: POCBNP,      Time Spent in minutes   35   Chyrl Elwell K M.D on 08/24/2014 at 8:04 AM  Between 7am  to 7pm - Pager - [phone]  After 7pm go to [email or web address] - password [email or web address]  And look for the night coverage person covering for me after hours  [facility] Office  [phone]   **Disclaimer: This note may have been dictated with voice recognition software. Similar sounding words can inadvertently be transcribed and this note may contain transcription errors which may not have been corrected upon publication of note.**

## 2014-08-24 NOTE — Care Management Note (Signed)
  Page 2 of 2   08/24/2014     12:12:22 PM CARE MANAGEMENT NOTE 08/24/2014  Patient:  [name], [name]   Account Number:  [id number]  Date Initiated:  08/24/2014  Documentation initiated by:  Auther Lyerly  Subjective/Objective Assessment:   active gi bleed to endo for controll, requiring a unit of bld after hgb dropped 1.5 grams     Action/Plan:   home when stable   Anticipated DC Date:  08/27/2014   Anticipated DC Plan:  HOME/SELF CARE  In-house referral  NA      DC Planning Services  CM consult      PAC Choice  NA   Choice offered to / List presented to:  NA   DME arranged  NA      DME agency  NA     [place] arranged  NA      [place] agency  NA   Status of service:  In process, will continue to follow Medicare Important Message given?   (If response is "NO", the following Medicare IM given date fields will be blank) Date Medicare IM given:   Medicare IM given by:   Date Additional Medicare IM given:   Additional Medicare IM given by:    Discharge Disposition:    Per UR Regulation:  Reviewed for med. necessity/level of care/duration of stay  If discussed at Gas of Stay Meetings, dates discussed:    Comments:  [id number]/Donnel Venuto Rosana Hoes, RN, BSN, CCM Chart reviewed. Discharge needs and patient's stay to be reviewed and followed by case manager.

## 2014-08-24 NOTE — Progress Notes (Signed)
Pt VSS. Pt transferred to [id number]. Full report given to 4W RN Anderson Malta.

## 2014-08-25 ENCOUNTER — Encounter (HOSPITAL_COMMUNITY): Payer: Self-pay | Admitting: Internal Medicine

## 2014-08-25 ENCOUNTER — Other Ambulatory Visit: Payer: Self-pay

## 2014-08-25 DIAGNOSIS — D62 Acute posthemorrhagic anemia: Secondary | ICD-10-CM

## 2014-08-25 LAB — CBC
HCT: 22.4 % — ABNORMAL LOW (ref 39.0–52.0)
HEMOGLOBIN: 7.6 g/dL — AB (ref 13.0–17.0)
MCH: 29.8 pg (ref 26.0–34.0)
MCHC: 33.9 g/dL (ref 30.0–36.0)
MCV: 87.8 fL (ref 78.0–100.0)
PLATELETS: 150 10*3/uL (ref 150–400)
RBC: 2.55 MIL/uL — AB (ref 4.22–5.81)
RDW: 13.6 % (ref 11.5–15.5)
WBC: 11.1 10*3/uL — AB (ref 4.0–10.5)

## 2014-08-25 LAB — HEMOGLOBIN AND HEMATOCRIT, BLOOD
HCT: 22.6 % — ABNORMAL LOW (ref 39.0–52.0)
HEMOGLOBIN: 7.7 g/dL — AB (ref 13.0–17.0)

## 2014-08-25 MED ORDER — FOLIC ACID 1 MG PO TABS: 1.0000 mg | ORAL_TABLET | Freq: Every day | ORAL | Status: DC

## 2014-08-25 MED ORDER — FERROUS SULFATE 325 (65 FE) MG PO TABS
325.0000 mg | ORAL_TABLET | Freq: Two times a day (BID) | ORAL | Status: DC
  Administered 2014-08-25: 325 mg via ORAL
  Filled 2014-08-25 (×3): qty 1

## 2014-08-25 MED ORDER — FERROUS SULFATE 325 (65 FE) MG PO TABS: 325.0000 mg | ORAL_TABLET | Freq: Two times a day (BID) | ORAL | Status: DC

## 2014-08-25 MED ORDER — SUCRALFATE 1 GM/10ML PO SUSP
1.0000 g | Freq: Three times a day (TID) | ORAL | Status: DC
Start: 2014-08-25 — End: 2014-09-18

## 2014-08-25 MED ORDER — PANTOPRAZOLE SODIUM 40 MG PO TBEC
40.0000 mg | DELAYED_RELEASE_TABLET | Freq: Two times a day (BID) | ORAL | Status: DC
  Administered 2014-08-25: 40 mg via ORAL
  Filled 2014-08-25: qty 1

## 2014-08-25 MED ORDER — PANTOPRAZOLE SODIUM 40 MG PO TBEC: 40.0000 mg | DELAYED_RELEASE_TABLET | Freq: Two times a day (BID) | ORAL | Status: DC

## 2014-08-25 NOTE — Progress Notes (Signed)
     [facility] Progress Note  Subjective:  Stools getting back to normal color; just small amounts of darker colored stool/older looking blood.  Feels good and wants to go home.  Objective:  Vital signs in last 24 hours: Temp:  [97.6 F (36.4 C)-98 F (36.7 C)] 97.8 F (36.6 C) (10/02 0527) Pulse Rate:  [72-104] 76 (10/02 0527) Resp:  [5-32] 20 (10/02 0527) BP: (102-126)/(53-103) 113/66 mmHg (10/02 0527) SpO2:  [96 %-100 %] 99 % (10/02 0527) Weight:  [244 lb 4.3 oz (110.8 kg)] 244 lb 4.3 oz (110.8 kg) (10/02 0527) Last BM Date: 08/25/14 General:  Alert, Well-developed, in NAD Heart:  Regular rate and rhythm; no murmurs Pulm:  CTAB.  No W/R/R. Abdomen:  Soft, non-distended. Normal bowel sounds.  Non-tender. Extremities:  Without edema. Neurologic:  Alert and  oriented x4;  grossly normal neurologically. Psych:  Alert and cooperative. Normal mood and affect.  Intake/Output from previous day: 10/01 0701 - 10/02 0700 In: 1233 [P.O.:930; I.V.:303] Out: 600 [Urine:600]  Lab Results:  Recent Labs  08/24/14 0335  08/24/14 1445 08/25/14 0230 08/25/14 0633  WBC 12.2*  --  14.2* 11.1*  --   HGB 8.2*  < > 8.0* 7.6* 7.7*  HCT 23.6*  < > 23.3* 22.4* 22.6*  PLT 129*  --  153 150  --   < > = values in this interval not displayed. BMET  Recent Labs  08/23/14 1333 08/24/14 0335  NA 138 140  K 4.7 4.2  CL 103 107  CO2 22 23  GLUCOSE 137* 113*  BUN 53* 35*  CREATININE 1.31 0.96  CALCIUM 8.5 7.8*   LFT  Recent Labs  08/23/14 1333  PROT 6.1  ALBUMIN 3.4*  AST 13  ALT 11  ALKPHOS 40  BILITOT 0.7   PT/INR  Recent Labs  08/23/14 1351  LABPROT 14.4  INR 1.12   Dg Abd Portable 1v  08/23/2014   CLINICAL DATA:  Nasogastric tube placement.  EXAM: PORTABLE ABDOMEN - 1 VIEW  COMPARISON:  None.  FINDINGS: Nasogastric terminates at the body of the stomach. Non-obstructive bowel gas pattern. Probable phleboliths in the left hemipelvis.  IMPRESSION:  nasogastric tube terminating at the body of the stomach.   Electronically Signed   By: Abigail Miyamoto M.D.   On: 08/23/2014 15:53    Assessment / Plan: -Duodenal bulb ulcer with viz vessel:  S/P BICAP and epi injection during EGD 10/1. -Severe Class D esophagitis:  Also seen on EGD 10/1. -UGIB:  Secondary to the above. -ABLA:  Secondary to the above.  *Will discontinue PPI gtt.  Needs BID PPI for at least 8 weeks. *Avoid NSAID's. *If Hpylori positive then will treat. *Office visit follow-up made with Dr. Fuller Plan on 10/26 at 10:45 am at which time we will schedule repeat EGD.  Will repeat CBC next week. *Will advance diet to regular and anticipate that he can be discharged this afternoon.    LOS: 2 days   Railyn House D.  08/25/2014, 9:08 AM  Pager number [phone]

## 2014-08-25 NOTE — Progress Notes (Signed)
Patient seen, examined, and I agree with the above documentation, including the assessment and plan. Stools returned to normal color, no further melena Hgb is stable now Okay for discharge home, call or return for any further melena or bleeding BID PPI for at least 8 weeks, no NSAIDs, I will followup H pylori. CBC at the office next week, activity as tolerated, but hold off on brisk exercise for at least 1 week.  Iron rich diet Office followup with Fuller Plan and then repeat EGD in 8-12 weeks to document healing.  Pt reports occasional solid food dysphagia and may benefit from dilation if esophagitis has healed. Also due for repeat surveillance colonoscopy

## 2014-08-25 NOTE — Discharge Summary (Signed)
[name], is a 58 y.o. male  DOB 27-Jan-1956  MRN [id number].  Admission date:  08/23/2014  Admitting Physician  Thurnell Lose, MD  Discharge Date:  08/25/2014   Primary MD  Pricilla Riffle. Fuller Plan, MD  Recommendations for primary care physician for things to follow:   Check CBC CMP in a week   Admission Diagnosis  Other and unspecified hyperlipidemia [E78.5] Anemia due to blood loss [D50.0] GI bleed due to NSAIDs [K92.2, T39.395A]   Discharge Diagnosis  Other and unspecified hyperlipidemia [E78.5] Anemia due to blood loss [D50.0] GI bleed due to NSAIDs [K92.2, T39.395A]    Active Problems:   HYPERLIPIDEMIA   PEPTIC ULCER, ACUTE, HEMORRHAGE, HX OF   GI bleed due to NSAIDs   UGI bleed   Acute esophagitis   Gastric erosions   Duodenal ulcer hemorrhagic      Past Medical History  Diagnosis Date  . Gastric ulcer     Past Surgical History  Procedure Laterality Date  . Esophagogastroduodenoscopy N/A 08/24/2014    Procedure: ESOPHAGOGASTRODUODENOSCOPY (EGD);  Surgeon: Jerene Bears, MD;  Location: [facility];  Service: Endoscopy;  Laterality: N/A;       History of present illness and  Hospital Course:     Kindly see H&P for history of present illness and admission details, please review complete Labs, Consult reports and Test reports for all details in brief  HPI  from the history and physical done on the day of admission  [name] is a 58 y.o. male, with history of NSAID induced gastric ulcer in the past, dyslipidemia recently had a tooth extraction 4 days ago and then was placed on scheduled NSAID comes in with two to 3 day history of epigastric pain, nausea vomiting with blood in vomitus, melena followed by bright red blood per stool. He started to feel quite weak and dizzy, went to urgent care from where  he was sent to ER and diagnosed with acute upper GI bleed with blood loss related anemia. Patient denies any headache, no chest pain palpitations, no shortness of breath, no weight loss, no focal weakness.   He is currently receiving transfusion and feeling better, hemodynamically stable now, he was initially orthostatic,  GI was consulted who requested hospitalist admission.    Hospital Course    1. NSAID-induced upper GI bleed likely due to gastric and duodenal ulcers along with esophagitis. Post 2 units H&H are now stable, still having black stool but likely old blood, which from IV PPI to oral twice a day, in by GI today and cleared for home discharge, she will be placed on twice a day PPI, iron supplementation and Karafate with outpatient GI followup. He feels better and currently symptom-free. Counseled not to use NSAID products in the future.   2. Dyslipidemia. Outpatient followup.     3. Leukocytosis. Reactive versus hemoconcentration. Improving.     Discharge Condition: stable   Follow UP  Follow-up Information   Follow up with Norberto Sorenson T. Fuller Plan, MD On 09/18/2014. (10:45  am)    Specialty:  Gastroenterology   Contact information:   [address] [place] [address] [phone]       Follow up with [facility]    . (or any urgent care in 7 days for CBC, CMP)    Contact information:   [place] [address] [phone]        Discharge Instructions  and  Discharge Medications      Discharge Instructions   Discharge instructions    Complete by:  As directed   Follow with Primary MD Pricilla Riffle. Fuller Plan, MD in 7 days   Get CBC, CMP checked  by Primary MD next visit.    Activity: As tolerated with Full fall precautions use walker/cane & assistance as needed   Disposition Home     Diet: Heart Healthy    For Heart failure patients - Check your Weight same time everyday, if you gain over 2  pounds, or you develop in leg swelling, experience more shortness of breath or chest pain, call your Primary MD immediately. Follow Cardiac Low Salt Diet and 1.8 lit/day fluid restriction.   On your next visit with her primary care physician please Get Medicines reviewed and adjusted.  Please request your Prim.MD to go over all Hospital Tests and Procedure/Radiological results at the follow up, please get all Hospital records sent to your Prim MD by signing hospital release before you go home.   If you experience worsening of your admission symptoms, develop shortness of breath, life threatening emergency, suicidal or homicidal thoughts you must seek medical attention immediately by calling 911 or calling your MD immediately  if symptoms less severe.  You Must read complete instructions/literature along with all the possible adverse reactions/side effects for all the Medicines you take and that have been prescribed to you. Take any new Medicines after you have completely understood and accpet all the possible adverse reactions/side effects.   Do not drive, operating heavy machinery, perform activities at heights, swimming or participation in water activities or provide baby sitting services if your were admitted for syncope or siezures until you have seen by Primary MD or a Neurologist and advised to do so again.  Do not drive when taking Pain medications.    Do not take more than prescribed Pain, Sleep and Anxiety Medications  Special Instructions: If you have smoked or chewed Tobacco  in the last 2 yrs please stop smoking, stop any regular Alcohol  and or any Recreational drug use.  Wear Seat belts while driving.   Please note  You were cared for by a hospitalist during your hospital stay. If you have any questions about your discharge medications or the care you received while you were in the hospital after you are discharged, you can call the unit and asked to speak with the hospitalist  on call if the hospitalist that took care of you is not available. Once you are discharged, your primary care physician will handle any further medical issues. Please note that NO REFILLS for any discharge medications will be authorized once you are discharged, as it is imperative that you return to your primary care physician (or establish a relationship with a primary care physician if you do not have one) for your aftercare needs so that they can reassess your need for medications and monitor your lab values.     Increase activity slowly    Complete by:  As directed  Medication List    STOP taking these medications       ibuprofen 200 MG tablet  Commonly known as:  ADVIL,MOTRIN     NF FORMULAS TESTOSTERONE PO     ranitidine 150 MG tablet  Commonly known as:  ZANTAC      TAKE these medications       ferrous sulfate 325 (65 FE) MG tablet  Take 1 tablet (325 mg total) by mouth 2 (two) times daily with a meal.     folic acid 1 MG tablet  Commonly known as:  FOLVITE  Take 1 tablet (1 mg total) by mouth daily.     OMEGA 3 PO  Take 2 capsules by mouth daily.     pantoprazole 40 MG tablet  Commonly known as:  PROTONIX  Take 1 tablet (40 mg total) by mouth 2 (two) times daily.     sucralfate 1 GM/10ML suspension  Commonly known as:  CARAFATE  Take 10 mLs (1 g total) by mouth 4 (four) times daily -  with meals and at bedtime.          Diet and Activity recommendation: See Discharge Instructions above   Consults obtained - GI   Major procedures and Radiology Reports - PLEASE review detailed and final reports for all details, in brief -   EGD  ENDOSCOPIC IMPRESSION:   1. There was severe, LA Class D, esophagitis noted  2. Four erosions were found in the gastric antrum  3. Benign appearing small, proximal gastric polyps  3. Single ulcer measuring 10 x 56mm in size was found in the duodenal bulb; Submucosal injection of 9ml of epinephrine 1:10,000  was  performed around the bleeding site; Bipolar (BICAP) cautery with a 7Fr probe was applied to the site for 10 secs; with good  treatment effect  4. Normal appearing 2nd part of the duodenum    RECOMMENDATIONS:  1. Continue PPI infusion for another 24 hours  2. Monitor hemoglobin, transfuse if necessary  3. Check H. pylori serum antibody, treated positive  4. Avoid all NSAIDs  5. Continue twice-daily PPI at discharge for at least 8 weeks.   Followup with Dr. Fuller Plan with plans to repeat endoscopy in 8-12 weeks to document healing of esophagitis and ulcer disease   eSigned: Jerene Bears, MD 08/24/2014 9:09 AM    Dg Abd Portable 1v  08/23/2014   CLINICAL DATA:  Nasogastric tube placement.  EXAM: PORTABLE ABDOMEN - 1 VIEW  COMPARISON:  None.  FINDINGS: Nasogastric terminates at the body of the stomach. Non-obstructive bowel gas pattern. Probable phleboliths in the left hemipelvis.  IMPRESSION: nasogastric tube terminating at the body of the stomach.   Electronically Signed   By: Abigail Miyamoto M.D.   On: 08/23/2014 15:53    Micro Results      Recent Results (from the past 240 hour(s))  MRSA PCR SCREENING     Status: None   Collection Time    08/23/14  4:58 PM      Result Value Ref Range Status   MRSA by PCR NEGATIVE  NEGATIVE Final   Comment:            The GeneXpert MRSA Assay (FDA     approved for NASAL specimens     only), is one component of a     comprehensive MRSA colonization     surveillance program. It is not     intended to diagnose MRSA     infection nor to  guide or     monitor treatment for     MRSA infections.       Today   Subjective:   Sanav Remer today has no headache,no chest abdominal pain,no new weakness tingling or numbness, feels much better wants to go home today.    Objective:   Blood pressure 113/66, pulse 76, temperature 97.8 F (36.6 C), temperature source Oral, resp. rate 20, height 5\' 11"  (1.803 m), weight 110.8 kg (244 lb 4.3 oz), SpO2  99.00%.   Intake/Output Summary (Last 24 hours) at 08/25/14 1058 Last data filed at 08/25/14 0630  Gross per 24 hour  Intake   1033 ml  Output    600 ml  Net    433 ml    Exam Awake Alert, Oriented x 3, No new F.N deficits, Normal affect [place].AT,PERRAL Supple Neck,No JVD, No cervical lymphadenopathy appriciated.  Symmetrical Chest wall movement, Good air movement bilaterally, CTAB RRR,No Gallops,Rubs or new Murmurs, No Parasternal Heave +ve B.Sounds, Abd Soft, Non tender, No organomegaly appriciated, No rebound -guarding or rigidity. No Cyanosis, Clubbing or edema, No new Rash or bruise  Data Review   CBC w Diff: Lab Results  Component Value Date   WBC 11.1* 08/25/2014   HGB 7.7* 08/25/2014   HCT 22.6* 08/25/2014   PLT 150 08/25/2014   LYMPHOPCT 26.9 10/03/2008   MONOPCT 9.2 10/03/2008   EOSPCT 14.8* 10/03/2008   BASOPCT 0.6 10/03/2008    CMP: Lab Results  Component Value Date   NA 140 08/24/2014   K 4.2 08/24/2014   CL 107 08/24/2014   CO2 23 08/24/2014   BUN 35* 08/24/2014   CREATININE 0.96 08/24/2014   PROT 6.1 08/23/2014   ALBUMIN 3.4* 08/23/2014   BILITOT 0.7 08/23/2014   ALKPHOS 40 08/23/2014   AST 13 08/23/2014   ALT 11 08/23/2014  .   Total Time in preparing paper work, data evaluation and todays exam - 35 minutes  Thurnell Lose M.D on 08/25/2014 at 10:58 AM  [facility] Office  [phone]   **Disclaimer: This note may have been dictated with voice recognition software. Similar sounding words can inadvertently be transcribed and this note may contain transcription errors which may not have been corrected upon publication of note.**

## 2014-08-25 NOTE — Progress Notes (Signed)
Patient Demographics  [name], is a 58 y.o. male, DOB - 1956/11/02, TKW:[id number]  Admit date - 08/23/2014   Admitting Physician Thurnell Lose, MD  Outpatient Primary MD for the patient is Pricilla Riffle. Fuller Plan, MD  LOS - 2   Chief Complaint  Patient presents with  . GI Bleeding        Subjective:   [name] today has, No headache, No chest pain, no abdominal pain - No Nausea, No new weakness tingling or numbness, No Cough - SOB.    Assessment & Plan     1. NSAID-induced upper GI bleed likely due to gastric and duodenal ulcers along with esophagitis. Post 2 units H&H are now stable, still having black stool but likely old blood, which from IV PPI to oral twice a day, will monitor, if cleared by GI would discharge home on twice a day PPI, iron supplementation and Karafate with outpatient GI followup. He feels better and currently symptom-free.    2. Dyslipidemia. Outpatient followup.     3. Leukocytosis. Reactive versus hemoconcentration. Improving.      Code Status: Full  Family Communication: wife  Disposition Plan: Home in am   Procedures   EGD  ENDOSCOPIC IMPRESSION:  1. There was severe, LA Class D, esophagitis noted  2. Four erosions were found in the gastric antrum  3. Benign appearing small, proximal gastric polyps  3. Single ulcer measuring 10 x 53mm in size was found in the duodenal bulb; Submucosal injection of 19ml of epinephrine 1:10,000  was performed around the bleeding site; Bipolar (BICAP) cautery with a 7Fr probe was applied to the site for 10 secs; with good  treatment effect  4. Normal appearing 2nd part of the duodenum    RECOMMENDATIONS:  1. Continue PPI infusion for another 24 hours  2. Monitor hemoglobin, transfuse if necessary  3.  Check H. pylori serum antibody, treated positive  4. Avoid all NSAIDs  5. Continue twice-daily PPI at discharge for at least 8 weeks.   Followup with Dr. Fuller Plan with plans to repeat endoscopy in 8-12 weeks to document healing of esophagitis and ulcer disease  eSigned: Jerene Bears, MD 08/24/2014 9:09 AM    Consults  LB. GI   Medications  Scheduled Meds: . ferrous sulfate  325 mg Oral BID WC  . pantoprazole  40 mg Oral BID  . sodium chloride  3 mL Intravenous Q12H  . sucralfate  1 g Oral TID WC & HS   Continuous Infusions:   PRN Meds:.diphenhydrAMINE, guaiFENesin-dextromethorphan, morphine injection, ondansetron (ZOFRAN) IV, sodium chloride  DVT Prophylaxis  SCDs   Lab Results  Component Value Date   PLT 150 08/25/2014    Antibiotics     Anti-infectives   None          Objective:   Filed Vitals:   08/24/14 1300 08/24/14 1400 08/24/14 2315 08/25/14 0527  BP: 103/71 110/59 103/61 113/66  Pulse: 85 79 82 76  Temp:   98 F (36.7 C) 97.8 F (36.6 C)  TempSrc:   Oral Oral  Resp: 16 28 20 20   Height:      Weight:    110.8 kg (244 lb 4.3 oz)  SpO2: 96% 98% 99% 99%  Wt Readings from Last 3 Encounters:  08/25/14 110.8 kg (244 lb 4.3 oz)  08/25/14 110.8 kg (244 lb 4.3 oz)     Intake/Output Summary (Last 24 hours) at 08/25/14 0931 Last data filed at 08/25/14 0630  Gross per 24 hour  Intake   1033 ml  Output    600 ml  Net    433 ml     Physical Exam  Awake Alert, Oriented X 3, No new F.N deficits, Normal affect [place].AT,PERRAL Supple Neck,No JVD, No cervical lymphadenopathy appriciated.  Symmetrical Chest wall movement, Good air movement bilaterally, CTAB RRR,No Gallops,Rubs or new Murmurs, No Parasternal Heave +ve B.Sounds, Abd Soft, minimal epigastric/LUQ tenderness, No organomegaly appriciated, No rebound - guarding or rigidity. No Cyanosis, Clubbing or edema, No new Rash or bruise      Data Review   Micro Results Recent Results (from the past  240 hour(s))  MRSA PCR SCREENING     Status: None   Collection Time    08/23/14  4:58 PM      Result Value Ref Range Status   MRSA by PCR NEGATIVE  NEGATIVE Final   Comment:            The GeneXpert MRSA Assay (FDA     approved for NASAL specimens     only), is one component of a     comprehensive MRSA colonization     surveillance program. It is not     intended to diagnose MRSA     infection nor to guide or     monitor treatment for     MRSA infections.    Radiology Reports Dg Abd Portable 1v  08/23/2014   CLINICAL DATA:  Nasogastric tube placement.  EXAM: PORTABLE ABDOMEN - 1 VIEW  COMPARISON:  None.  FINDINGS: Nasogastric terminates at the body of the stomach. Non-obstructive bowel gas pattern. Probable phleboliths in the left hemipelvis.  IMPRESSION: nasogastric tube terminating at the body of the stomach.   Electronically Signed   By: Abigail Miyamoto M.D.   On: 08/23/2014 15:53     CBC  Recent Labs Lab 08/23/14 1333  08/24/14 0335 08/24/14 0750 08/24/14 1445 08/25/14 0230 08/25/14 0633  WBC 16.9*  --  12.2*  --  14.2* 11.1*  --   HGB 9.3*  < > 8.2* 8.0* 8.0* 7.6* 7.7*  HCT 26.4*  < > 23.6* 23.0* 23.3* 22.4* 22.6*  PLT 197  --  129*  --  153 150  --   MCV 87.7  --  88.4  --  88.3 87.8  --   MCH 30.9  --  30.7  --  30.3 29.8  --   MCHC 35.2  --  34.7  --  34.3 33.9  --   RDW 13.0  --  13.6  --  13.6 13.6  --   < > = values in this interval not displayed.  Chemistries   Recent Labs Lab 08/23/14 1333 08/24/14 0335  NA 138 140  K 4.7 4.2  CL 103 107  CO2 22 23  GLUCOSE 137* 113*  BUN 53* 35*  CREATININE 1.31 0.96  CALCIUM 8.5 7.8*  AST 13  --   ALT 11  --   ALKPHOS 40  --   BILITOT 0.7  --    ------------------------------------------------------------------------------------------------------------------ estimated creatinine clearance is 108.8 ml/min (by C-G formula based on Cr of  0.96). ------------------------------------------------------------------------------------------------------------------ No results found for this basename: HGBA1C,  in the last 72 hours ------------------------------------------------------------------------------------------------------------------ No results  found for this basename: CHOL, HDL, LDLCALC, TRIG, CHOLHDL, LDLDIRECT,  in the last 72 hours ------------------------------------------------------------------------------------------------------------------ No results found for this basename: TSH, T4TOTAL, FREET3, T3FREE, THYROIDAB,  in the last 72 hours ------------------------------------------------------------------------------------------------------------------ No results found for this basename: VITAMINB12, FOLATE, FERRITIN, TIBC, IRON, RETICCTPCT,  in the last 72 hours  Coagulation profile  Recent Labs Lab 08/23/14 1351  INR 1.12    No results found for this basename: DDIMER,  in the last 72 hours  Cardiac Enzymes No results found for this basename: CK, CKMB, TROPONINI, MYOGLOBIN,  in the last 168 hours ------------------------------------------------------------------------------------------------------------------ No components found with this basename: POCBNP,      Time Spent in minutes   35   SINGH,PRASHANT K M.D on 08/25/2014 at 9:31 AM  Between 7am to 7pm - Pager - [phone]  After 7pm go to [email or web address] - password [email or web address]  And look for the night coverage person covering for me after hours  [facility] Office  [phone]   **Disclaimer: This note may have been dictated with voice recognition software. Similar sounding words can inadvertently be transcribed and this note may contain transcription errors which may not have been corrected upon publication of note.**

## 2014-08-25 NOTE — Discharge Instructions (Signed)
Follow with Primary MD Pricilla Riffle. Fuller Plan, MD in 7 days   Get CBC, CMP checked  by Primary MD next visit.    Activity: As tolerated with Full fall precautions use walker/cane & assistance as needed   Disposition Home     Diet: Heart Healthy    For Heart failure patients - Check your Weight same time everyday, if you gain over 2 pounds, or you develop in leg swelling, experience more shortness of breath or chest pain, call your Primary MD immediately. Follow Cardiac Low Salt Diet and 1.8 lit/day fluid restriction.   On your next visit with her primary care physician please Get Medicines reviewed and adjusted.  Please request your Prim.MD to go over all Hospital Tests and Procedure/Radiological results at the follow up, please get all Hospital records sent to your Prim MD by signing hospital release before you go home.   If you experience worsening of your admission symptoms, develop shortness of breath, life threatening emergency, suicidal or homicidal thoughts you must seek medical attention immediately by calling 911 or calling your MD immediately  if symptoms less severe.  You Must read complete instructions/literature along with all the possible adverse reactions/side effects for all the Medicines you take and that have been prescribed to you. Take any new Medicines after you have completely understood and accpet all the possible adverse reactions/side effects.   Do not drive, operating heavy machinery, perform activities at heights, swimming or participation in water activities or provide baby sitting services if your were admitted for syncope or siezures until you have seen by Primary MD or a Neurologist and advised to do so again.  Do not drive when taking Pain medications.    Do not take more than prescribed Pain, Sleep and Anxiety Medications  Special Instructions: If you have smoked or chewed Tobacco  in the last 2 yrs please stop smoking, stop any regular Alcohol  and or any  Recreational drug use.  Wear Seat belts while driving.   Please note  You were cared for by a hospitalist during your hospital stay. If you have any questions about your discharge medications or the care you received while you were in the hospital after you are discharged, you can call the unit and asked to speak with the hospitalist on call if the hospitalist that took care of you is not available. Once you are discharged, your primary care physician will handle any further medical issues. Please note that NO REFILLS for any discharge medications will be authorized once you are discharged, as it is imperative that you return to your primary care physician (or establish a relationship with a primary care physician if you do not have one) for your aftercare needs so that they can reassess your need for medications and monitor your lab values.

## 2014-08-27 LAB — TYPE AND SCREEN
ABO/RH(D): O POS
Antibody Screen: NEGATIVE
UNIT DIVISION: 0
Unit division: 0
Unit division: 0
Unit division: 0

## 2014-08-29 LAB — HELICOBACTER PYLORI ABS-IGG+IGA, BLD
H Pylori IgA: 7.2 U/mL (ref <9.0)
H Pylori IgG: 0.56 {ISR}

## 2014-08-30 ENCOUNTER — Other Ambulatory Visit (INDEPENDENT_AMBULATORY_CARE_PROVIDER_SITE_OTHER): Payer: BC Managed Care – PPO

## 2014-08-30 DIAGNOSIS — D62 Acute posthemorrhagic anemia: Secondary | ICD-10-CM

## 2014-08-30 LAB — CBC WITH DIFFERENTIAL/PLATELET
BASOS ABS: 0.1 10*3/uL (ref 0.0–0.1)
Basophils Relative: 0.5 % (ref 0.0–3.0)
EOS PCT: 8.4 % — AB (ref 0.0–5.0)
Eosinophils Absolute: 1 10*3/uL — ABNORMAL HIGH (ref 0.0–0.7)
HCT: 27.5 % — ABNORMAL LOW (ref 39.0–52.0)
Hemoglobin: 9 g/dL — ABNORMAL LOW (ref 13.0–17.0)
LYMPHS PCT: 18.8 % (ref 12.0–46.0)
Lymphs Abs: 2.1 10*3/uL (ref 0.7–4.0)
MCHC: 32.7 g/dL (ref 30.0–36.0)
MCV: 91.5 fl (ref 78.0–100.0)
MONOS PCT: 8.6 % (ref 3.0–12.0)
Monocytes Absolute: 1 10*3/uL (ref 0.1–1.0)
Neutro Abs: 7.3 10*3/uL (ref 1.4–7.7)
Neutrophils Relative %: 63.7 % (ref 43.0–77.0)
PLATELETS: 290 10*3/uL (ref 150.0–400.0)
RBC: 3 Mil/uL — ABNORMAL LOW (ref 4.22–5.81)
RDW: 14.4 % (ref 11.5–15.5)
WBC: 11.4 10*3/uL — AB (ref 4.0–10.5)

## 2014-08-31 ENCOUNTER — Encounter: Payer: Self-pay | Admitting: Gastroenterology

## 2014-09-13 ENCOUNTER — Encounter: Payer: Self-pay | Admitting: Gastroenterology

## 2014-09-18 ENCOUNTER — Ambulatory Visit (INDEPENDENT_AMBULATORY_CARE_PROVIDER_SITE_OTHER): Payer: BC Managed Care – PPO | Admitting: Gastroenterology

## 2014-09-18 ENCOUNTER — Other Ambulatory Visit: Payer: Self-pay

## 2014-09-18 ENCOUNTER — Encounter: Payer: Self-pay | Admitting: Gastroenterology

## 2014-09-18 ENCOUNTER — Other Ambulatory Visit (INDEPENDENT_AMBULATORY_CARE_PROVIDER_SITE_OTHER): Payer: BC Managed Care – PPO

## 2014-09-18 VITALS — BP 128/80 | HR 78 | Ht 70.0 in | Wt 240.0 lb

## 2014-09-18 DIAGNOSIS — D62 Acute posthemorrhagic anemia: Secondary | ICD-10-CM

## 2014-09-18 DIAGNOSIS — K264 Chronic or unspecified duodenal ulcer with hemorrhage: Secondary | ICD-10-CM

## 2014-09-18 DIAGNOSIS — K209 Esophagitis, unspecified without bleeding: Secondary | ICD-10-CM

## 2014-09-18 DIAGNOSIS — D509 Iron deficiency anemia, unspecified: Secondary | ICD-10-CM

## 2014-09-18 DIAGNOSIS — R1314 Dysphagia, pharyngoesophageal phase: Secondary | ICD-10-CM

## 2014-09-18 DIAGNOSIS — Z86010 Personal history of colonic polyps: Secondary | ICD-10-CM

## 2014-09-18 LAB — CBC WITH DIFFERENTIAL/PLATELET
Basophils Absolute: 0.1 10*3/uL (ref 0.0–0.1)
Basophils Relative: 1.1 % (ref 0.0–3.0)
Eosinophils Absolute: 1.3 10*3/uL — ABNORMAL HIGH (ref 0.0–0.7)
HEMATOCRIT: 36.4 % — AB (ref 39.0–52.0)
Hemoglobin: 11.8 g/dL — ABNORMAL LOW (ref 13.0–17.0)
LYMPHS ABS: 2.1 10*3/uL (ref 0.7–4.0)
Lymphocytes Relative: 24.5 % (ref 12.0–46.0)
MCHC: 32.3 g/dL (ref 30.0–36.0)
MCV: 89.8 fl (ref 78.0–100.0)
MONOS PCT: 11 % (ref 3.0–12.0)
Monocytes Absolute: 0.9 10*3/uL (ref 0.1–1.0)
NEUTROS ABS: 4 10*3/uL (ref 1.4–7.7)
Neutrophils Relative %: 47.9 % (ref 43.0–77.0)
Platelets: 223 10*3/uL (ref 150.0–400.0)
RBC: 4.05 Mil/uL — ABNORMAL LOW (ref 4.22–5.81)
RDW: 14.2 % (ref 11.5–15.5)
WBC: 8.4 10*3/uL (ref 4.0–10.5)

## 2014-09-18 MED ORDER — MOVIPREP 100 G PO SOLR: 1.0000 | Freq: Once | ORAL | Status: DC

## 2014-09-18 NOTE — Patient Instructions (Signed)
You have been scheduled for an endoscopy and colonoscopy. Please follow the written instructions given to you at your visit today. Please pick up your prep at the pharmacy within the next 1-3 days. If you use inhalers (even only as needed), please bring them with you on the day of your procedure. Your physician has requested that you go to www.[email or web address].com(THIS WAS SENT TO YOUR E-MAIL) and enter the access code given to you at your visit today. This web site gives a general overview about your procedure. However, you should still follow specific instructions given to you by our office regarding your preparation for the procedure.  Your physician has requested that you go to the basement for the following lab work before leaving today: CBC

## 2014-09-18 NOTE — Progress Notes (Signed)
    History of Present Illness: This is a 58 year old male hospitalized about 3 weeks ago with an acute GI bleed. I reviewed the hospitalization records. Duodenal ulcer with a visible vessel was identified and was treated. LA class grade D esophagitis was noted. H. pylori antibody was negative. His hemoglobin dropped to 7.6 during hospitalization and was 9.0 one week after discharge. He denies any recurrent bleeding. His appetite is good. He notes that his energy level is not back to normal. His stools are slightly dark on iron. He relates intermittent solid food dysphagia for the past several years primarily with state.  Current Medications, Allergies, Past Medical History, Past Surgical History, Family History and Social History were reviewed in [employer] record.  Physical Exam: General: Well developed , well nourished, no acute distress Head: Normocephalic and atraumatic Eyes:  sclerae anicteric, EOMI Ears: Normal auditory acuity Mouth: No deformity or lesions Lungs: Clear throughout to auscultation Heart: Regular rate and rhythm; no murmurs, rubs or bruits Abdomen: Soft, non tender and non distended. No masses, hepatosplenomegaly or hernias noted. Normal Bowel sounds Musculoskeletal: Symmetrical with no gross deformities  Pulses:  Normal pulses noted Extremities: No clubbing, cyanosis, edema or deformities noted Neurological: Alert oriented x 4, grossly nonfocal Psychological:  Alert and cooperative. Normal mood and affect  Assessment and Recommendations:  1. Duodenal ulcer with recent bleed. Avoid aspirin and NSAIDs. Continue PPI twice daily for 8 weeks and then PPI qam long term.  2. LA class grade D erosive esophagitis and intermittent solid food dysphagia. Continue PPI twice daily  for 8 weeks and then every morning long-term. Continue standard antireflux measures. EGD with dilation to document healing and for dysphagia. The risks, benefits, and alternatives  to endoscopy with possible biopsy and possible dilation were discussed with the patient and they consent to proceed.   3. Posthemorrhagic anemia. Continue daily iron. Repeat CBC.  4. Personal history of adenomatous colon polyps. He is due for surveillance colonoscopy. The risks, benefits, and alternatives to colonoscopy with possible biopsy and possible polypectomy were discussed with the patient and they consent to proceed.

## 2014-09-20 ENCOUNTER — Encounter: Payer: Self-pay | Admitting: Gastroenterology

## 2014-11-06 ENCOUNTER — Ambulatory Visit (AMBULATORY_SURGERY_CENTER): Payer: BC Managed Care – PPO | Admitting: Gastroenterology

## 2014-11-06 ENCOUNTER — Encounter: Payer: Self-pay | Admitting: Gastroenterology

## 2014-11-06 VITALS — BP 131/82 | HR 76 | Temp 98.1°F | Resp 16 | Ht 70.0 in | Wt 240.0 lb

## 2014-11-06 DIAGNOSIS — R1314 Dysphagia, pharyngoesophageal phase: Secondary | ICD-10-CM

## 2014-11-06 DIAGNOSIS — D120 Benign neoplasm of cecum: Secondary | ICD-10-CM

## 2014-11-06 DIAGNOSIS — K208 Other esophagitis: Secondary | ICD-10-CM

## 2014-11-06 DIAGNOSIS — Z86010 Personal history of colonic polyps: Secondary | ICD-10-CM

## 2014-11-06 DIAGNOSIS — D123 Benign neoplasm of transverse colon: Secondary | ICD-10-CM

## 2014-11-06 DIAGNOSIS — K209 Esophagitis, unspecified without bleeding: Secondary | ICD-10-CM

## 2014-11-06 DIAGNOSIS — K2210 Ulcer of esophagus without bleeding: Secondary | ICD-10-CM

## 2014-11-06 DIAGNOSIS — Z860101 Personal history of adenomatous and serrated colon polyps: Secondary | ICD-10-CM

## 2014-11-06 DIAGNOSIS — K264 Chronic or unspecified duodenal ulcer with hemorrhage: Secondary | ICD-10-CM

## 2014-11-06 NOTE — Op Note (Signed)
[facility]  [address] [place], [id number]   ENDOSCOPY PROCEDURE REPORT  PATIENT: [name], [name]  MR#: [id number] BIRTHDATE: May 30, 1956 , 34  yrs. old GENDER: male ENDOSCOPIST: Ladene Artist, MD, [facility] REFERRED BY: PROCEDURE DATE:  11/06/2014 PROCEDURE:  EGD w/ biopsy and EGD w/ wire guided (savary) dilation  ASA CLASS:     Class II INDICATIONS:  Follow up of LA Class D esophagitis and a DU. MEDICATIONS: Residual sedation present, Monitored anesthesia care, and Propofol 100 mg IV TOPICAL ANESTHETIC: none  DESCRIPTION OF PROCEDURE: After the risks benefits and alternatives of the procedure were thoroughly explained, informed consent was obtained.  The LB [id number] endoscope was introduced through the mouth and advanced to the second portion of the duodenum , Without limitations.  The instrument was slowly withdrawn as the mucosa was fully examined.   ESOPHAGUS: The z-line was located 40cm from the incisors.  The z line appeared variable and biopsies were taken.  Prior erosive esophagitis has healed. Slight stenosis at EGJ. The esophagus was otherwise normal. STOMACH: The mucosa of the stomach appeared normal. DUODENUM: The duodenal mucosa showed no abnormalities. Prior DU has healed.  Retroflexed views revealed a small hiatal hernia.   Savary dilation over a guidewire performed with 16 mm and 17 mm dilators with minimal resistance and no heme noted.  The scope was then withdrawn from the patient and the procedure completed.  COMPLICATIONS: There were no immediate complications.  ENDOSCOPIC IMPRESSION: 1.   Variable z-line with slight stenosis at EGJ; biopsies obtained; dilation performed 2.   Small hiatal hernia 3.   The EGD otherwise appeared normal  RECOMMENDATIONS: 1.  Anti-reflux regimen long term 2.  Await pathology results 3.  Continue PPI qam long term 4.  Post dilation instructions  eSigned:  Ladene Artist, MD, [facility]  11/06/2014 2:53 PM

## 2014-11-06 NOTE — Patient Instructions (Addendum)
YOU HAD AN ENDOSCOPIC PROCEDURE TODAY AT [facility]: Refer to the procedure report that was given to you for any specific questions about what was found during the examination.  If the procedure report does not answer your questions, please call your gastroenterologist to clarify.  If you requested that your care partner not be given the details of your procedure findings, then the procedure report has been included in a sealed envelope for you to review at your convenience later.  YOU SHOULD EXPECT: Some feelings of bloating in the abdomen. Passage of more gas than usual.  Walking can help get rid of the air that was put into your GI tract during the procedure and reduce the bloating. If you had a lower endoscopy (such as a colonoscopy or flexible sigmoidoscopy) you may notice spotting of blood in your stool or on the toilet paper. If you underwent a bowel prep for your procedure, then you may not have a normal bowel movement for a few days.  DIET: NOTHING TO EAT OR DRINK UNTIL 4 PM. 4 PM UNTIL 5 PM ONLY CLEAR LIQUIDS. AFTER 5PM ONLY SOFT FOODS. RESUME YOUR REGULAR DIET IN AM.  ACTIVITY: Your care partner should take you home directly after the procedure.  You should plan to take it easy, moving slowly for the rest of the day.  You can resume normal activity the day after the procedure however you should NOT DRIVE or use heavy machinery for 24 hours (because of the sedation medicines used during the test).    SYMPTOMS TO REPORT IMMEDIATELY: A gastroenterologist can be reached at any hour.  During normal business hours, 8:30 AM to 5:00 PM Monday through Friday, call [phone].  After hours and on weekends, please call the GI answering service at [phone] who will take a message and have the physician on call contact you.   Following lower endoscopy (colonoscopy or flexible sigmoidoscopy):  Excessive amounts of blood in the stool  Significant tenderness or worsening of  abdominal pains  Swelling of the abdomen that is new, acute  Fever of 100F or higher  Following upper endoscopy (EGD)  Vomiting of blood or coffee ground material  New chest pain or pain under the shoulder blades  Painful or persistently difficult swallowing  New shortness of breath  Fever of 100F or higher  Black, tarry-looking stools  FOLLOW UP: If any biopsies were taken you will be contacted by phone or by letter within the next 1-3 weeks.  Call your gastroenterologist if you have not heard about the biopsies in 3 weeks.  Our staff will call the home number listed on your records the next business day following your procedure to check on you and address any questions or concerns that you may have at that time regarding the information given to you following your procedure. This is a courtesy call and so if there is no answer at the home number and we have not heard from you through the emergency physician on call, we will assume that you have returned to your regular daily activities without incident.  SIGNATURES/CONFIDENTIALITY: You and/or your care partner have signed paperwork which will be entered into your electronic medical record.  These signatures attest to the fact that that the information above on your After Visit Summary has been reviewed and is understood.  Full responsibility of the confidentiality of this discharge information lies with you and/or your care-partner.

## 2014-11-06 NOTE — Op Note (Signed)
[facility]  [address] [place], [id number]   COLONOSCOPY PROCEDURE REPORT  PATIENT: [name], [name]  MR#: [id number] BIRTHDATE: 12-01-1955 , 73  yrs. old GENDER: male ENDOSCOPIST: Ladene Artist, MD, [facility] PROCEDURE DATE:  11/06/2014 PROCEDURE:   Colonoscopy with biopsy First Screening Colonoscopy - Avg.  risk and is 50 yrs.  old or older - No.  Prior Negative Screening - Now for repeat screening. N/A  History of Adenoma - Now for follow-up colonoscopy & has been > or = to 3 yrs.  Yes hx of adenoma.  Has been 3 or more years since last colonoscopy.  Polyps Removed Today? Yes. ASA CLASS:   Class II INDICATIONS:surveillance colonoscopy based on a history of adenomatous colonic polyp(s). MEDICATIONS: Monitored anesthesia care, Propofol 270 mg IV, and lidocaine 40 mg IV DESCRIPTION OF PROCEDURE:   After the risks benefits and alternatives of the procedure were thoroughly explained, informed consent was obtained.  The digital rectal exam revealed no abnormalities of the rectum.   The LB [id number]  endoscope was introduced through the anus and advanced to the cecum, which was identified by both the appendix and ileocecal valve. No adverse events experienced.   The quality of the prep was excellent, using MoviPrep  The instrument was then slowly withdrawn as the colon was fully examined.    COLON FINDINGS: Two sessile polyps measuring 4 mm in size were found in the transverse colon and at the cecum.  Polypectomies were performed with cold forceps.  The resection was complete, the polyp tissue was completely retrieved and sent to histology.   The examination was otherwise normal.  Retroflexed views revealed no abnormalities. The time to cecum=1 minutes 57 seconds.  Withdrawal time=8 minutes 14 seconds.  The scope was withdrawn and the procedure completed.  COMPLICATIONS: There were no immediate complications.  ENDOSCOPIC IMPRESSION: 1.   Two sessile  polyps in the transverse colon and at the cecum; polypectomies performed with cold forceps 2.   The examination was otherwise normal  RECOMMENDATIONS: 1.  Await pathology results 2.  Repeat Colonoscopy in 5 years.  eSigned:  Ladene Artist, MD, [facility] 11/06/2014 2:38 PM

## 2014-11-06 NOTE — Progress Notes (Signed)
Called to room to assist during endoscopic procedure.  Patient ID and intended procedure confirmed with present staff. Received instructions for my participation in the procedure from the performing physician.  

## 2014-11-06 NOTE — Progress Notes (Signed)
Pt suctioned well after EGD, awake coughing, supported airway Sa02 98 % To RR HOB elevated stable

## 2014-11-07 ENCOUNTER — Telehealth: Payer: Self-pay

## 2014-11-07 NOTE — Telephone Encounter (Signed)
Left a message at [phone] for the pt to call us back if any questions or concerns.maw

## 2014-11-17 ENCOUNTER — Encounter: Payer: Self-pay | Admitting: Gastroenterology

## 2014-12-01 ENCOUNTER — Other Ambulatory Visit: Payer: Self-pay | Admitting: Internal Medicine

## 2014-12-13 ENCOUNTER — Other Ambulatory Visit: Payer: Self-pay | Admitting: Gastroenterology

## 2015-02-02 ENCOUNTER — Other Ambulatory Visit: Payer: Self-pay

## 2015-02-02 MED ORDER — PANTOPRAZOLE SODIUM 40 MG PO TBEC: 40.0000 mg | DELAYED_RELEASE_TABLET | Freq: Every day | ORAL | Status: DC

## 2015-10-26 ENCOUNTER — Ambulatory Visit (INDEPENDENT_AMBULATORY_CARE_PROVIDER_SITE_OTHER): Payer: BLUE CROSS/BLUE SHIELD | Admitting: Physician Assistant

## 2015-10-26 ENCOUNTER — Encounter: Payer: Self-pay | Admitting: Physician Assistant

## 2015-10-26 VITALS — BP 100/70 | HR 68 | Ht 70.0 in | Wt 232.8 lb

## 2015-10-26 DIAGNOSIS — Z86010 Personal history of colonic polyps: Secondary | ICD-10-CM

## 2015-10-26 DIAGNOSIS — K219 Gastro-esophageal reflux disease without esophagitis: Secondary | ICD-10-CM

## 2015-10-26 DIAGNOSIS — K279 Peptic ulcer, site unspecified, unspecified as acute or chronic, without hemorrhage or perforation: Secondary | ICD-10-CM

## 2015-10-26 DIAGNOSIS — Z8719 Personal history of other diseases of the digestive system: Secondary | ICD-10-CM

## 2015-10-26 MED ORDER — PANTOPRAZOLE SODIUM 40 MG PO TBEC: 40.0000 mg | DELAYED_RELEASE_TABLET | Freq: Every day | ORAL | Status: DC

## 2015-10-26 NOTE — Progress Notes (Signed)
Reviewed and agree with management plan.  Jaeleen Inzunza T. Zahira Brummond, MD FACG 

## 2015-10-26 NOTE — Progress Notes (Signed)
Patient ID: [name], male   DOB: January 05, 1956, 59 y.o.   MRN: [id number]   Subjective:    Patient ID: [name], male    DOB: [birth year], 59 y.o.   MRN: [id number]  HPI [name]  is a pleasant 59 year old white male known to Dr. Fuller Plan who comes in today for routine follow-up. He has history of a duodenal ulcer with major GI bleed in October 2015. This was felt due to NSAIDs. He had follow-up EGD in December 2015 and the ulcer had healed, he was also noted to have an early esophageal stricture which was dilated to 17 mm. He also had colonoscopy done in December 2015 finding of 2 sessile polyps in the transverse colon one of these was a sessile serrated adenoma and one was a benign lymphoid polyp. He was advised to have 5 year interval follow-up. Patient has no current complaints. He says he has not been taking Protonix on a daily basis and wonders if he should. He has not been taking any aspirin or NSAIDs. He has no complaints of abdominal discomfort and nausea heartburn indigestion or dysphagia. No changes in bowel habits melena or hematochezia. He says he does get occasional heartburn depending on diet.  Review of Systems Pertinent positive and negative review of systems were noted in the above HPI section.  All other review of systems was otherwise negative. Outpatient Encounter Prescriptions as of 10/26/2015  Medication Sig  . Omega-3 Fatty Acids (OMEGA 3 PO) Take 2 capsules by mouth daily.  . pantoprazole (PROTONIX) 40 MG tablet Take 1 tablet (40 mg total) by mouth daily.  . [DISCONTINUED] pantoprazole (PROTONIX) 40 MG tablet Take 1 tablet (40 mg total) by mouth daily.  . [DISCONTINUED] ferrous sulfate 325 (65 FE) MG tablet Take 1 tablet (325 mg total) by mouth 2 (two) times daily with a meal.  . [DISCONTINUED] folic acid (FOLVITE) 1 MG tablet Take 1 tablet (1 mg total) by mouth daily. (Patient not taking: Reported on 11/06/2014)   No facility-administered encounter medications on file  as of 10/26/2015.   Allergies  Allergen Reactions  . Ibuprofen Other (See Comments)    Severe GI Bleed and PUD     Patient Active Problem List   Diagnosis Date Noted  . Acute esophagitis 08/24/2014  . Gastric erosions 08/24/2014  . Duodenal ulcer hemorrhagic 08/24/2014  . GI bleed due to NSAIDs 08/23/2014  . UGI bleed 08/23/2014  . HYPERLIPIDEMIA 10/03/2008  . PEPTIC ULCER, ACUTE, HEMORRHAGE, HX OF 10/03/2008   Social History   Social History  . Marital Status: Married    Spouse Name: N/A  . Number of Children: N/A  . Years of Education: N/A   Occupational History  . Not on file.   Social History Main Topics  . Smoking status: Never Smoker   . Smokeless tobacco: Never Used  . Alcohol Use: No  . Drug Use: No  . Sexual Activity: Yes   Other Topics Concern  . Not on file   Social History Narrative    [name] family history includes Diabetes in his father; Esophageal cancer in his paternal grandmother; Heart disease in his brother, father, and sister; Liver cancer in his maternal grandmother.      Objective:    Filed Vitals:   10/26/15 0840  BP: 100/70  Pulse: 68    Physical Exam    Acute distress, pleasant blood pressure 100/70 pulse 68 height 5 foot 10 weight 232. HEENT ;nontraumatic, cephalic EOMI PERRLA sclera anicteric, Cardiovascular;  regular rate and rhythm with S1-S2 no murmur or gallop, Pulmonary ;clear bilaterally, Abdomen ;soft nontender nondistended bowel sounds are active no palpable mass or hepatosplenomegaly, Rectal ;exam not done, Neuropsych; mood and affect appropriate       Assessment & Plan:   #1 59 yo male with hx of duodenal ulcer 10 /2015 with major GI bleed- NSAID induced- healed on EGD 10/2014 #2 GERD with hx esophageal stricture- s/p dilation 10/2014-asymptomatic #3 hx adenomatous polyps- due for follow up 10/2019  Plan; Refill Protonix 40 mg po QD x one year  Reviewed antireflux regimen  Follow up Dr Fuller Plan one year or  sooner prn   [name] [name] PA-C 10/26/2015   Cc: No ref. provider found

## 2015-10-26 NOTE — Patient Instructions (Signed)
We sent you refills to the [facility] for Protonix 40 MG.   Your colonoscopy recall is in for 10-25-2019.

## 2015-11-06 IMAGING — DX DG ABD PORTABLE 1V
1 series · 1 of 1 positions shown · non-contrast
Comparison: None.

CLINICAL DATA: Nasogastric tube placement.

EXAM:
PORTABLE ABDOMEN - 1 VIEW

[abdomen kub]
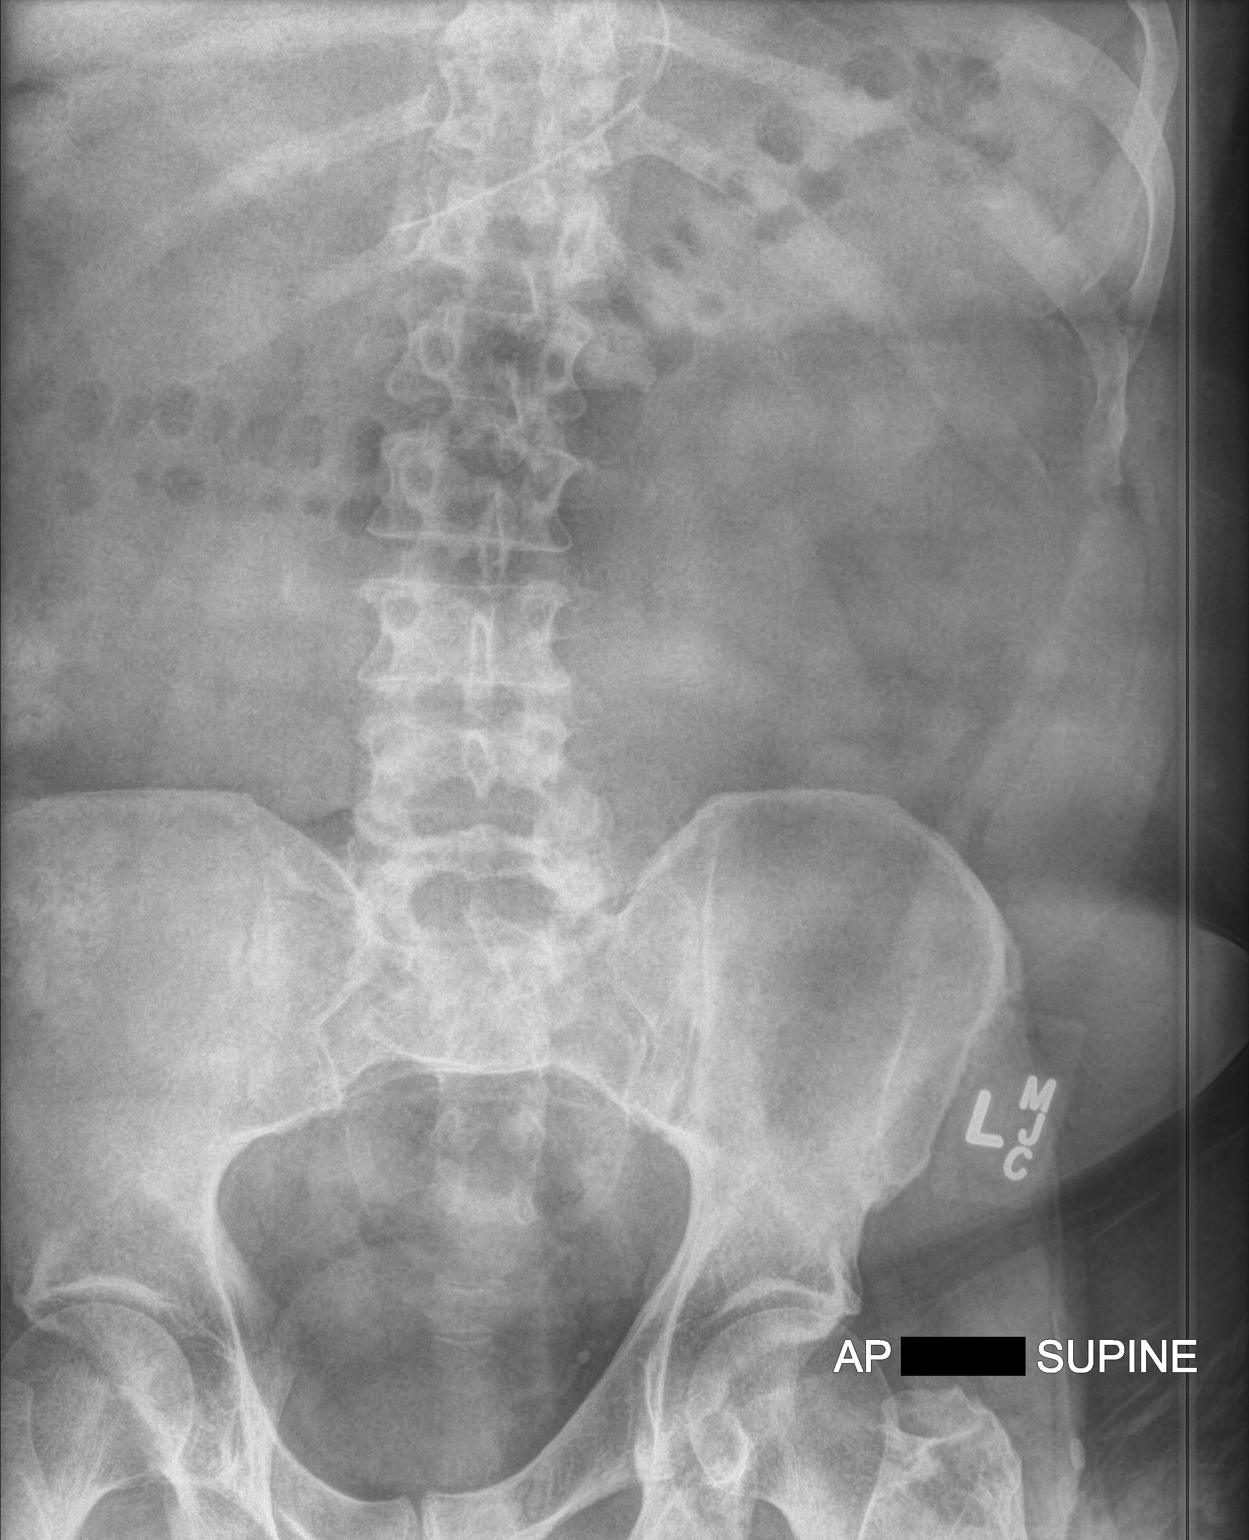

[1 of 1 positions shown; findings below may reference images not displayed]

FINDINGS: Nasogastric terminates at the body of the stomach. Non-obstructive
bowel gas pattern. Probable phleboliths in the left hemipelvis.
IMPRESSION: nasogastric tube terminating at the body of the stomach.

## 2016-08-26 ENCOUNTER — Other Ambulatory Visit: Payer: Self-pay | Admitting: Physician Assistant

## 2016-09-22 ENCOUNTER — Other Ambulatory Visit: Payer: Self-pay | Admitting: *Deleted

## 2016-09-22 ENCOUNTER — Telehealth: Payer: Self-pay | Admitting: *Deleted

## 2016-09-22 MED ORDER — PANTOPRAZOLE SODIUM 40 MG PO TBEC: DELAYED_RELEASE_TABLET | ORAL | 3 refills | Status: DC

## 2016-09-22 NOTE — Telephone Encounter (Signed)
Called [facility] , , ordered pantoprazole sodium 40 mg, #90 with 3 refills. He is to take 1 tab daily in the morning.  Amy Esterwood PA, prescriber.

## 2017-01-19 ENCOUNTER — Telehealth: Payer: Self-pay

## 2017-01-19 NOTE — Telephone Encounter (Signed)
[facility]. He can call the [facility] offices are most convenient located for him and check to see who is taking patients. Perhaps [facility] has most availability.

## 2017-01-20 ENCOUNTER — Other Ambulatory Visit: Payer: Self-pay | Admitting: *Deleted

## 2017-01-20 ENCOUNTER — Inpatient Hospital Stay (HOSPITAL_COMMUNITY)
Admission: EM | Admit: 2017-01-20 | Discharge: 2017-01-28 | DRG: 234 | Disposition: A | Discharge status: Home / Self Care | Payer: BLUE CROSS/BLUE SHIELD | Attending: Cardiothoracic Surgery | Admitting: Cardiothoracic Surgery

## 2017-01-20 ENCOUNTER — Emergency Department (HOSPITAL_COMMUNITY): Payer: BLUE CROSS/BLUE SHIELD

## 2017-01-20 ENCOUNTER — Encounter (HOSPITAL_COMMUNITY)
Admission: EM | Disposition: A | Discharge status: Home / Self Care | Payer: Self-pay | Source: Home / Self Care | Attending: Cardiothoracic Surgery

## 2017-01-20 ENCOUNTER — Encounter (HOSPITAL_COMMUNITY): Payer: Self-pay

## 2017-01-20 DIAGNOSIS — Z01810 Encounter for preprocedural cardiovascular examination: Secondary | ICD-10-CM | POA: Diagnosis not present

## 2017-01-20 DIAGNOSIS — R748 Abnormal levels of other serum enzymes: Secondary | ICD-10-CM | POA: Diagnosis not present

## 2017-01-20 DIAGNOSIS — I2510 Atherosclerotic heart disease of native coronary artery without angina pectoris: Secondary | ICD-10-CM

## 2017-01-20 DIAGNOSIS — I214 Non-ST elevation (NSTEMI) myocardial infarction: Secondary | ICD-10-CM | POA: Diagnosis not present

## 2017-01-20 DIAGNOSIS — I249 Acute ischemic heart disease, unspecified: Secondary | ICD-10-CM

## 2017-01-20 DIAGNOSIS — K219 Gastro-esophageal reflux disease without esophagitis: Secondary | ICD-10-CM | POA: Diagnosis present

## 2017-01-20 DIAGNOSIS — I2584 Coronary atherosclerosis due to calcified coronary lesion: Secondary | ICD-10-CM | POA: Diagnosis present

## 2017-01-20 DIAGNOSIS — I200 Unstable angina: Secondary | ICD-10-CM

## 2017-01-20 DIAGNOSIS — E785 Hyperlipidemia, unspecified: Secondary | ICD-10-CM | POA: Diagnosis present

## 2017-01-20 DIAGNOSIS — Z79899 Other long term (current) drug therapy: Secondary | ICD-10-CM

## 2017-01-20 DIAGNOSIS — I4891 Unspecified atrial fibrillation: Secondary | ICD-10-CM | POA: Diagnosis not present

## 2017-01-20 DIAGNOSIS — Z6833 Body mass index (BMI) 33.0-33.9, adult: Secondary | ICD-10-CM | POA: Diagnosis not present

## 2017-01-20 DIAGNOSIS — R9431 Abnormal electrocardiogram [ECG] [EKG]: Secondary | ICD-10-CM | POA: Diagnosis not present

## 2017-01-20 DIAGNOSIS — D72829 Elevated white blood cell count, unspecified: Secondary | ICD-10-CM | POA: Diagnosis not present

## 2017-01-20 DIAGNOSIS — E669 Obesity, unspecified: Secondary | ICD-10-CM | POA: Diagnosis not present

## 2017-01-20 DIAGNOSIS — E8770 Fluid overload, unspecified: Secondary | ICD-10-CM | POA: Diagnosis not present

## 2017-01-20 DIAGNOSIS — Z8249 Family history of ischemic heart disease and other diseases of the circulatory system: Secondary | ICD-10-CM | POA: Diagnosis not present

## 2017-01-20 DIAGNOSIS — I25110 Atherosclerotic heart disease of native coronary artery with unstable angina pectoris: Secondary | ICD-10-CM | POA: Diagnosis present

## 2017-01-20 DIAGNOSIS — K5900 Constipation, unspecified: Secondary | ICD-10-CM | POA: Diagnosis not present

## 2017-01-20 DIAGNOSIS — I208 Other forms of angina pectoris: Secondary | ICD-10-CM | POA: Diagnosis not present

## 2017-01-20 DIAGNOSIS — Z951 Presence of aortocoronary bypass graft: Secondary | ICD-10-CM | POA: Diagnosis not present

## 2017-01-20 DIAGNOSIS — R079 Chest pain, unspecified: Secondary | ICD-10-CM | POA: Diagnosis present

## 2017-01-20 DIAGNOSIS — I480 Paroxysmal atrial fibrillation: Secondary | ICD-10-CM | POA: Diagnosis not present

## 2017-01-20 HISTORY — PX: LEFT HEART CATH AND CORONARY ANGIOGRAPHY: CATH118249

## 2017-01-20 LAB — I-STAT TROPONIN, ED: Troponin i, poc: 0.26 ng/mL (ref 0.00–0.08)

## 2017-01-20 LAB — CBC
HEMATOCRIT: 44.7 % (ref 39.0–52.0)
HEMOGLOBIN: 15.3 g/dL (ref 13.0–17.0)
MCH: 29.8 pg (ref 26.0–34.0)
MCHC: 34.2 g/dL (ref 30.0–36.0)
MCV: 87.1 fL (ref 78.0–100.0)
Platelets: 247 10*3/uL (ref 150–400)
RBC: 5.13 MIL/uL (ref 4.22–5.81)
RDW: 12.9 % (ref 11.5–15.5)
WBC: 10 10*3/uL (ref 4.0–10.5)

## 2017-01-20 LAB — TROPONIN I
TROPONIN I: 4.89 ng/mL — AB (ref <0.03)
TROPONIN I: 3.72 ng/mL — AB (ref <0.03)

## 2017-01-20 LAB — COMPREHENSIVE METABOLIC PANEL
ALBUMIN: 4.2 g/dL (ref 3.5–5.0)
ALT: 22 U/L (ref 17–63)
AST: 29 U/L (ref 15–41)
Alkaline Phosphatase: 53 U/L (ref 38–126)
Anion gap: 7 (ref 5–15)
BILIRUBIN TOTAL: 1.1 mg/dL (ref 0.3–1.2)
BUN: 24 mg/dL — AB (ref 6–20)
CO2: 27 mmol/L (ref 22–32)
Calcium: 9.2 mg/dL (ref 8.9–10.3)
Chloride: 102 mmol/L (ref 101–111)
Creatinine, Ser: 0.91 mg/dL (ref 0.61–1.24)
GFR calc Af Amer: >60 mL/min (ref >60)
GFR calc non Af Amer: >60 mL/min (ref >60)
GLUCOSE: 108 mg/dL — AB (ref 65–99)
POTASSIUM: 4.4 mmol/L (ref 3.5–5.1)
SODIUM: 136 mmol/L (ref 135–145)
Total Protein: 7.4 g/dL (ref 6.5–8.1)

## 2017-01-20 LAB — URINALYSIS, ROUTINE W REFLEX MICROSCOPIC
Bilirubin Urine: NEGATIVE
Glucose, UA: NEGATIVE mg/dL
Hgb urine dipstick: NEGATIVE
Ketones, ur: NEGATIVE mg/dL
Leukocytes, UA: NEGATIVE
Nitrite: NEGATIVE
Protein, ur: NEGATIVE mg/dL
Specific Gravity, Urine: 1.036 — ABNORMAL HIGH (ref 1.005–1.030)
pH: 6 (ref 5.0–8.0)

## 2017-01-20 SURGERY — LEFT HEART CATH AND CORONARY ANGIOGRAPHY
Anesthesia: LOCAL

## 2017-01-20 MED ORDER — ASPIRIN 81 MG PO CHEW
324.0000 mg | CHEWABLE_TABLET | Freq: Once | ORAL | Status: AC
  Administered 2017-01-20: 324 mg via ORAL
  Filled 2017-01-20: qty 4

## 2017-01-20 MED ORDER — ATORVASTATIN CALCIUM 40 MG PO TABS
40.0000 mg | ORAL_TABLET | Freq: Every day | ORAL | Status: DC
  Administered 2017-01-20 – 2017-01-27 (×7): 40 mg via ORAL
  Filled 2017-01-20 (×7): qty 1

## 2017-01-20 MED ORDER — SODIUM CHLORIDE 0.9% FLUSH: 3.0000 mL | Freq: Two times a day (BID) | INTRAVENOUS | Status: DC

## 2017-01-20 MED ORDER — PANTOPRAZOLE SODIUM 40 MG PO TBEC
40.0000 mg | DELAYED_RELEASE_TABLET | Freq: Two times a day (BID) | ORAL | Status: DC
  Administered 2017-01-21 (×2): 40 mg via ORAL
  Filled 2017-01-20 (×2): qty 1

## 2017-01-20 MED ORDER — ACETAMINOPHEN 325 MG PO TABS: 650.0000 mg | ORAL_TABLET | ORAL | Status: DC | PRN

## 2017-01-20 MED ORDER — SODIUM CHLORIDE 0.9 % WEIGHT BASED INFUSION: 1.0000 mL/kg/h | INTRAVENOUS | Status: DC

## 2017-01-20 MED ORDER — LIDOCAINE HCL (PF) 1 % IJ SOLN
INTRAMUSCULAR | Status: AC
  Filled 2017-01-20: qty 30

## 2017-01-20 MED ORDER — HEPARIN SODIUM (PORCINE) 1000 UNIT/ML IJ SOLN
INTRAMUSCULAR | Status: DC | PRN
  Administered 2017-01-20: 5000 [IU] via INTRAVENOUS

## 2017-01-20 MED ORDER — HEPARIN (PORCINE) IN NACL 100-0.45 UNIT/ML-% IJ SOLN
1350.0000 [IU]/h | INTRAMUSCULAR | Status: DC
  Administered 2017-01-20: 1200 [IU]/h via INTRAVENOUS
  Administered 2017-01-21: 1350 [IU]/h via INTRAVENOUS
  Filled 2017-01-20 (×2): qty 250

## 2017-01-20 MED ORDER — HEPARIN (PORCINE) IN NACL 2-0.9 UNIT/ML-% IJ SOLN
INTRAMUSCULAR | Status: AC
  Filled 2017-01-20: qty 1000

## 2017-01-20 MED ORDER — LIDOCAINE HCL (PF) 1 % IJ SOLN
INTRAMUSCULAR | Status: DC | PRN
  Administered 2017-01-20: 2 mL

## 2017-01-20 MED ORDER — ASPIRIN EC 81 MG PO TBEC
81.0000 mg | DELAYED_RELEASE_TABLET | Freq: Every day | ORAL | Status: DC
  Administered 2017-01-21: 81 mg via ORAL
  Filled 2017-01-20: qty 1

## 2017-01-20 MED ORDER — ONDANSETRON HCL 4 MG/2ML IJ SOLN: 4.0000 mg | Freq: Four times a day (QID) | INTRAMUSCULAR | Status: DC | PRN

## 2017-01-20 MED ORDER — SODIUM CHLORIDE 0.9 % IV SOLN
250.0000 mL | INTRAVENOUS | Status: DC | PRN
  Administered 2017-01-22: 13:00:00 via INTRAVENOUS

## 2017-01-20 MED ORDER — SODIUM CHLORIDE 0.9% FLUSH: 3.0000 mL | INTRAVENOUS | Status: DC | PRN

## 2017-01-20 MED ORDER — SODIUM CHLORIDE 0.9% FLUSH
3.0000 mL | INTRAVENOUS | Status: DC | PRN
Start: 2017-01-21 — End: 2017-01-20

## 2017-01-20 MED ORDER — FENTANYL CITRATE (PF) 100 MCG/2ML IJ SOLN
INTRAMUSCULAR | Status: DC | PRN
  Administered 2017-01-20: 25 ug via INTRAVENOUS

## 2017-01-20 MED ORDER — SODIUM CHLORIDE 0.9 % WEIGHT BASED INFUSION: 100.0000 mL/h | INTRAVENOUS | Status: AC

## 2017-01-20 MED ORDER — FENTANYL CITRATE (PF) 100 MCG/2ML IJ SOLN
INTRAMUSCULAR | Status: AC
  Filled 2017-01-20: qty 2

## 2017-01-20 MED ORDER — SODIUM CHLORIDE 0.9 % WEIGHT BASED INFUSION: 3.0000 mL/kg/h | INTRAVENOUS | Status: DC

## 2017-01-20 MED ORDER — METOPROLOL TARTRATE 12.5 MG HALF TABLET
12.5000 mg | ORAL_TABLET | Freq: Two times a day (BID) | ORAL | Status: DC
  Administered 2017-01-20 – 2017-01-21 (×3): 12.5 mg via ORAL
  Filled 2017-01-20 (×3): qty 1

## 2017-01-20 MED ORDER — HEPARIN (PORCINE) IN NACL 2-0.9 UNIT/ML-% IJ SOLN
INTRAMUSCULAR | Status: DC | PRN
  Administered 2017-01-20: 1000 mL

## 2017-01-20 MED ORDER — IOPAMIDOL (ISOVUE-370) INJECTION 76%
INTRAVENOUS | Status: DC | PRN
  Administered 2017-01-20: 90 mL via INTRA_ARTERIAL

## 2017-01-20 MED ORDER — PANTOPRAZOLE SODIUM 40 MG PO TBEC
40.0000 mg | DELAYED_RELEASE_TABLET | Freq: Every day | ORAL | Status: DC
  Administered 2017-01-20: 40 mg via ORAL
  Filled 2017-01-20: qty 1

## 2017-01-20 MED ORDER — IOPAMIDOL (ISOVUE-370) INJECTION 76%
INTRAVENOUS | Status: AC
  Filled 2017-01-20: qty 100

## 2017-01-20 MED ORDER — VERAPAMIL HCL 2.5 MG/ML IV SOLN
INTRAVENOUS | Status: DC | PRN
  Administered 2017-01-20: 10 mL via INTRA_ARTERIAL

## 2017-01-20 MED ORDER — HEPARIN SODIUM (PORCINE) 1000 UNIT/ML IJ SOLN
INTRAMUSCULAR | Status: AC
  Filled 2017-01-20: qty 1

## 2017-01-20 MED ORDER — NITROGLYCERIN 2 % TD OINT: 1.0000 [in_us] | TOPICAL_OINTMENT | Freq: Once | TRANSDERMAL | Status: DC

## 2017-01-20 MED ORDER — SODIUM CHLORIDE 0.9 % IV SOLN
INTRAVENOUS | Status: DC | PRN
Start: 2017-01-20 — End: 2017-01-20
  Administered 2017-01-20: 1 mL/kg/h via INTRAVENOUS

## 2017-01-20 MED ORDER — MIDAZOLAM HCL 2 MG/2ML IJ SOLN
INTRAMUSCULAR | Status: DC | PRN
  Administered 2017-01-20: 2 mg via INTRAVENOUS

## 2017-01-20 MED ORDER — VERAPAMIL HCL 2.5 MG/ML IV SOLN
INTRAVENOUS | Status: AC
  Filled 2017-01-20: qty 2

## 2017-01-20 MED ORDER — NITROGLYCERIN 0.4 MG SL SUBL: 0.4000 mg | SUBLINGUAL_TABLET | SUBLINGUAL | Status: DC | PRN

## 2017-01-20 MED ORDER — SODIUM CHLORIDE 0.9 % IV SOLN: 250.0000 mL | INTRAVENOUS | Status: DC | PRN

## 2017-01-20 MED ORDER — MIDAZOLAM HCL 2 MG/2ML IJ SOLN
INTRAMUSCULAR | Status: AC
  Filled 2017-01-20: qty 2

## 2017-01-20 SURGICAL SUPPLY — 10 items

## 2017-01-20 NOTE — ED Triage Notes (Signed)
Pt was dx with the flu on the 14th, no more fever after 5 days Pt sates that his chest started hurting when he was on the treadmill

## 2017-01-20 NOTE — Progress Notes (Signed)
ANTICOAGULATION CONSULT NOTE - Initial Consult  Pharmacy Consult for Heparin Indication: Severe multivessel CAD, pending CVTS evaluation  Allergies  Allergen Reactions  . Ibuprofen Other (See Comments)    Severe GI Bleed and PUD      Patient Measurements: Height: 5\' 10"  (177.8 cm) Weight: 240 lb (108.9 kg) IBW/kg (Calculated) : 73 Heparin Dosing Weight: 97 kg  Vital Signs: Temp: 97.5 F (36.4 C) (02/27 1547) Temp Source: Oral (02/27 1547) BP: 138/79 (02/27 1600) Pulse Rate: 85 (02/27 1513)  Labs:  Recent Labs  01/20/17 0813 01/20/17 1334  HGB 15.3  --   HCT 44.7  --   PLT 247  --   CREATININE 0.91  --   TROPONINI  --  4.89*    Estimated Creatinine Clearance: 108 mL/min (by C-G formula based on SCr of 0.91 mg/dL).   Medical History: Past Medical History:  Diagnosis Date  . Adenomatous colon polyp   . Gastric erosions   . Gastric polyps   . Gastric ulcer   . GERD (gastroesophageal reflux disease)   . GI bleed due to NSAIDs   . HLD (hyperlipidemia)     Assessment: 4 YOM who presented on 2/27 with CP/USA and underwent cardiac cath that revealed severe multivessel CAD. Plans are to consult CVTS for possible CABG. Pharmacy has been consulted to start Heparin for anticoagulation to start 2 hours after TR band removal.  Per RN report - the TR band was removed at 1730 - will plan to initiate heparin at 1930 this evening. Hep Wt: 97 kg, baseline CBC wnl. The patient was not noted to be on any blood thinners PTA.  Goal of Therapy:  Heparin level 0.3-0.7 units/ml Monitor platelets by anticoagulation protocol: Yes   Plan:  1. Start Heparin at 1200 units/hr (12 ml/hr) starting at 1930 2. Daily heparin level, CBC 3. Will continue to monitor for any signs/symptoms of bleeding and will follow up with heparin level in 6 hours   Thank you for allowing pharmacy to be a part of this patient's care.  Alycia Rossetti, PharmD, BCPS Clinical Pharmacist Pager:  [phone] 01/20/2017 6:48 PM

## 2017-01-20 NOTE — ED Provider Notes (Signed)
[facility] DEPT Provider Note   CSN: [id number] Arrival date & time: 01/20/17  0706     History   Chief Complaint Chief Complaint  Patient presents with  . Chest Pain    HPI [name] is a 61 y.o. male.  The history is provided by the patient.  Chest Pain   This is a new problem. Episode onset: 1 week. Episode frequency: intermittent. The problem has been gradually worsening. The pain is associated with exertion, rest and walking. The pain is present in the substernal region. The pain is moderate. The quality of the pain is described as pressure-like and burning. The pain does not radiate. Associated symptoms include cough (for several weeks; was dx'd with influenza). Pertinent negatives include no fever, no lower extremity edema, no malaise/fatigue, no nausea, no shortness of breath and no vomiting. He has tried rest for the symptoms. Risk factors include smoking/tobacco exposure.  His past medical history is significant for hyperlipidemia.  Pertinent negatives for past medical history include no diabetes, no hypertension, no MI and no PE.  His family medical history is significant for CAD and early MI.  Procedure history is positive for stress thallium (8 yrs ago).    Past Medical History:  Diagnosis Date  . Adenomatous colon polyp   . Gastric erosions   . Gastric polyps   . Gastric ulcer   . GERD (gastroesophageal reflux disease)   . GI bleed due to NSAIDs   . HLD (hyperlipidemia)     Patient Active Problem List   Diagnosis Date Noted  . Acute esophagitis 08/24/2014  . Gastric erosions 08/24/2014  . Duodenal ulcer hemorrhagic 08/24/2014  . GI bleed due to NSAIDs 08/23/2014  . UGI bleed 08/23/2014  . HYPERLIPIDEMIA 10/03/2008  . PEPTIC ULCER, ACUTE, HEMORRHAGE, HX OF 10/03/2008    Past Surgical History:  Procedure Laterality Date  . ESOPHAGOGASTRODUODENOSCOPY N/A 08/24/2014   Procedure: ESOPHAGOGASTRODUODENOSCOPY (EGD);  Surgeon: Jerene Bears, MD;   Location: [facility];  Service: Endoscopy;  Laterality: N/A;       Home Medications    Prior to Admission medications   Medication Sig Start Date End Date Taking? Authorizing Provider  Omega-3 Fatty Acids (OMEGA 3 PO) Take 2 capsules by mouth daily.    Historical Provider, MD  pantoprazole (PROTONIX) 40 MG tablet Take 1 tab every morning , daily. 09/22/16   Amy Genia Harold, PA-C    Family History Family History  Problem Relation Age of Onset  . Heart disease Father   . Diabetes Father   . Heart disease Sister   . Heart disease Brother   . Liver cancer Maternal Grandmother   . Esophageal cancer Paternal Grandmother     Social History Social History  Substance Use Topics  . Smoking status: Never Smoker  . Smokeless tobacco: Never Used  . Alcohol use No     Allergies   Ibuprofen   Review of Systems Review of Systems  Constitutional: Negative for fever and malaise/fatigue.  Respiratory: Positive for cough (for several weeks; was dx'd with influenza). Negative for shortness of breath.   Cardiovascular: Positive for chest pain.  Gastrointestinal: Negative for nausea and vomiting.   Ten systems are reviewed and are negative for acute change except as noted in the HPI   Physical Exam Updated Vital Signs BP 138/79   Pulse 85   Temp 97.5 F (36.4 C) (Oral)   Resp 10   SpO2 98%   Physical Exam  Constitutional: He is oriented to  person, place, and time. He appears well-developed and well-nourished. No distress.  HENT:  Head: Normocephalic and atraumatic.  Nose: Nose normal.  Eyes: Conjunctivae and EOM are normal. Pupils are equal, round, and reactive to light. Right eye exhibits no discharge. Left eye exhibits no discharge. No scleral icterus.  Neck: Normal range of motion. Neck supple.  Cardiovascular: Normal rate and regular rhythm.  Exam reveals no gallop and no friction rub.   No murmur heard. Pulmonary/Chest: Effort normal and breath sounds normal. No  stridor. No respiratory distress. He has no rales.  Abdominal: Soft. He exhibits no distension. There is no tenderness.  Musculoskeletal: He exhibits no edema or tenderness.  Neurological: He is alert and oriented to person, place, and time.  Skin: Skin is warm and dry. No rash noted. He is not diaphoretic. No erythema.  Psychiatric: He has a normal mood and affect.  Vitals reviewed.    ED Treatments / Results  Labs (all labs ordered are listed, but only abnormal results are displayed) Labs Reviewed - No data to display  EKG  EKG Interpretation None       Radiology No results found.  Procedures Procedures (including critical care time) CRITICAL CARE Performed by: Grayce Sessions Lyrik Dockstader Total critical care time: 40 minutes Critical care time was exclusive of separately billable procedures and treating other patients. Critical care was necessary to treat or prevent imminent or life-threatening deterioration. Critical care was time spent personally by me on the following activities: development of treatment plan with patient and/or surrogate as well as nursing, discussions with consultants, evaluation of patient's response to treatment, examination of patient, obtaining history from patient or surrogate, ordering and performing treatments and interventions, ordering and review of laboratory studies, ordering and review of radiographic studies, pulse oximetry and re-evaluation of patient's condition.   Medications Ordered in ED Medications - No data to display   Initial Impression / Assessment and Plan / ED Course  I have reviewed the triage vital signs and the nursing notes.  Pertinent labs & imaging results that were available during my care of the patient were reviewed by me and considered in my medical decision making (see chart for details).  Clinical Course as of Jan 20 1833  Tue Jan 20, 2017  0745 Exertional chest pain. EKG with T-wave inversions in the inferior leads.  No prior EKG for comparison. No evidence of pericarditis.  [PC]  0801 Chest x-ray without evidence suggestive of pneumonia, pneumothorax, pneumomediastinum.  No abnormal contour of the mediastinum to suggest dissection. No evidence of acute injuries.   [PC]  [id number] +trop. Given 324 ASA. Holding heparin due to +hemoccult.  [PC]  [id number] Cardiology consultation  [PC]    Clinical Course User Index [PC] Fatima Blank, MD    Taken to the cath lab by Cardiology  Final Clinical Impressions(s) / ED Diagnoses   Final diagnoses:  Chest pain  NSTEMI (non-ST elevated myocardial infarction) [facility])      Fatima Blank, MD 01/20/17 [phone]

## 2017-01-20 NOTE — Interval H&P Note (Signed)
Cath Lab Visit (complete for each Cath Lab visit)  Clinical Evaluation Leading to the Procedure:   ACS: Yes.    Non-ACS:    Anginal Classification: CCS IV  Anti-ischemic medical therapy: No Therapy  Non-Invasive Test Results: No non-invasive testing performed  Prior CABG: No previous CABG      History and Physical Interval Note:  01/20/2017 2:37 PM  [name]  has presented today for surgery, with the diagnosis of cp  The various methods of treatment have been discussed with the patient and family. After consideration of risks, benefits and other options for treatment, the patient has consented to  Procedure(s): Left Heart Cath and Coronary Angiography (N/A) as a surgical intervention .  The patient's history has been reviewed, patient examined, no change in status, stable for surgery.  I have reviewed the patient's chart and labs.  Questions were answered to the patient's satisfaction.     Sherren Mocha

## 2017-01-20 NOTE — ED Triage Notes (Signed)
Pt complains of centralized chest pain for two days, he has reflux but states this is different

## 2017-01-20 NOTE — ED Notes (Signed)
MD aware of bp

## 2017-01-20 NOTE — Progress Notes (Signed)
TR band off.  

## 2017-01-20 NOTE — ED Notes (Signed)
Notified from [facility] that pt. Has an elevated troponin 4.89  Called the Cath Lab and spoke to  Joanna and results given.,

## 2017-01-20 NOTE — H&P (Addendum)
History & Physical    Patient ID: [name] MRN: [id number], DOB/AGE: 1956-07-26   Admit date: 01/20/2017   Primary Physician: No PCP Per Patient Primary Cardiologist: New  Patient Profile    61 yo male with PMH of HL, obesity, GERD and GI bleeding who presented with reports of chest pain.   Past Medical History    Past Medical History:  Diagnosis Date  . Adenomatous colon polyp   . Gastric erosions   . Gastric polyps   . Gastric ulcer   . GERD (gastroesophageal reflux disease)   . GI bleed due to NSAIDs   . HLD (hyperlipidemia)     Past Surgical History:  Procedure Laterality Date  . ESOPHAGOGASTRODUODENOSCOPY N/A 08/24/2014   Procedure: ESOPHAGOGASTRODUODENOSCOPY (EGD);  Surgeon: Jerene Bears, MD;  Location: [facility];  Service: Endoscopy;  Laterality: N/A;     Allergies  Allergies  Allergen Reactions  . Ibuprofen Other (See Comments)    Severe GI Bleed and PUD      History of Present Illness    [name] is a 61 yo male with PMH of HL, obesity, GERD and GI bleeding. Reports a strong family hx of CAD with father passing from MI in his late 43s, and brother/Sister with stents. States he did undergo a stress test a few years ago that was normal as a part of routine physical.   Reports he has been seeing GI, Dr. Fuller Plan for hematochezia and had plans to undergo a colonoscopy in the near future. Also had bronchitis, and Flu within the past 2 months but has since recovered. He was walking on the treadmill 2 saturdays ago when he developed centralized chest burning that lasted for about 30 minutes, relieved by rest. Same occurred again the next day. States over the past 2 weeks he has continued to have these episodes of chest burning. At first thought it was related to his usual GERD symptoms, but states these feel different and are more intense.   This morning he woke up around 4am with the same burning, but it was worse more intense.His symptoms lasted for a  few hours prompting him to come to the ED for further evaluation. Labs showed stable electrolytes, Hgb 15.3, Trop 0.26, Cr 0.91. CXR with mild atelectasis in the left lobe. EKG showed SR with T wave changes in the inferiolateral leads. Has also been hypertensive while in the ED, but reports otherwise his blood pressure is very well controlled.   Home Medications    Prior to Admission medications   Medication Sig Start Date End Date Taking? Authorizing Provider  Coenzyme Q10 (CO Q 10 PO) Take 1 tablet by mouth daily.   Yes Historical Provider, MD  Omega-3 Fatty Acids (OMEGA 3 PO) Take 2 capsules by mouth daily.   Yes Historical Provider, MD  OVER THE COUNTER MEDICATION Take 3 tablets by mouth daily. Nitrulline: Nitric Oxide Potentiator supplement.   Yes Historical Provider, MD  pantoprazole (PROTONIX) 40 MG tablet Take 1 tab every morning , daily. 09/22/16  Yes Amy S Esterwood, PA-C  PRESCRIPTION MEDICATION Inject 1 Dose into the muscle once. Per pt: received one dose of steroid shot from urgent care on [address] in [place].   Yes Historical Provider, MD  PRESCRIPTION MEDICATION Inject 1 Dose into the muscle once. Per pt: received 1 penicillin injection at urgent on [address] in [place]   Yes Historical Provider, MD  ZINC OXIDE PO Take 1 tablet by mouth daily.   Yes Historical  Provider, MD  doxycycline (VIBRAMYCIN) 100 MG capsule Take 100 mg by mouth 2 (two) times daily.  01/05/17   Historical Provider, MD  oseltamivir (TAMIFLU) 75 MG capsule Take 75 mg by mouth 2 (two) times daily.  01/07/17   Historical Provider, MD    Family History    Family History  Problem Relation Age of Onset  . Heart disease Father   . Diabetes Father   . Heart disease Sister   . Heart disease Brother   . Liver cancer Maternal Grandmother   . Esophageal cancer Paternal Grandmother     Social History    Social History   Social History  . Marital status: Married    Spouse name: N/A  . Number of  children: N/A  . Years of education: N/A   Occupational History  . Not on file.   Social History Main Topics  . Smoking status: Never Smoker  . Smokeless tobacco: Never Used  . Alcohol use No  . Drug use: No  . Sexual activity: Yes   Other Topics Concern  . Not on file   Social History Narrative  . No narrative on file     Review of Systems    General:  No chills, fever, night sweats or weight changes.  Cardiovascular:  See HPI Dermatological: No rash, lesions/masses Respiratory: No cough, dyspnea Urologic: No hematuria, dysuria Abdominal:   No nausea, vomiting, diarrhea, bright red blood per rectum, melena, or hematemesis Neurologic:  No visual changes, wkns, changes in mental status. All other systems reviewed and are otherwise negative except as noted above.  Physical Exam    Blood pressure (!) 153/108, pulse 77, temperature 97.7 F (36.5 C), temperature source Oral, resp. rate 16, SpO2 98 %.  General: Pleasant, NAD Psych: Normal affect. Neuro: Alert and oriented X 3. Moves all extremities spontaneously. HEENT: Normal  Neck: Supple without bruits or JVD. Lungs:  Resp regular and unlabored, CTA. Heart: RRR no s3, s4, or murmurs. Abdomen: Soft, non-tender, non-distended, BS + x 4.  Extremities: No clubbing, cyanosis or edema. DP/PT/Radials 2+ and equal bilaterally.  Labs    Troponin [facility] Test)  Recent Labs  01/20/17 0819  TROPIPOC 0.26*   No results for input(s): CKTOTAL, CKMB, TROPONINI in the last 72 hours. Lab Results  Component Value Date   WBC 10.0 01/20/2017   HGB 15.3 01/20/2017   HCT 44.7 01/20/2017   MCV 87.1 01/20/2017   PLT 247 01/20/2017    Recent Labs Lab 01/20/17 0813  NA 136  K 4.4  CL 102  CO2 27  BUN 24*  CREATININE 0.91  CALCIUM 9.2  PROT 7.4  BILITOT 1.1  ALKPHOS 53  ALT 22  AST 29  GLUCOSE 108*   Lab Results  Component Value Date   CHOL 226 (HH) 10/03/2008   HDL 46.7 10/03/2008   TRIG 33 10/03/2008    No results found for: [facility]   Radiology Studies    Dg Chest 2 View  Result Date: 01/20/2017 CLINICAL DATA:  61 year old male with left mid chest pain for 2 days. Recent flu diagnosis with some cough. Initial encounter. EXAM: CHEST  2 VIEW COMPARISON:  None. FINDINGS: Mildly low lung volumes. Normal cardiac size and mediastinal contours. Visualized tracheal air column is within normal limits. No pneumothorax, pulmonary edema, pleural effusion or consolidation. There is mild linear opacity at the left lung base. No other confluent pulmonary opacity. No acute osseous abnormality identified. Negative visible bowel gas pattern. IMPRESSION:  Mild linear opacity at the left lung base most resembles atelectasis. No other acute cardiopulmonary abnormality. Electronically Signed   By: Genevie Ann M.D.   On: 01/20/2017 08:10    ECG & Cardiac Imaging    EKG: SR with T wave changes in the inferiolateral leads  Assessment & Plan    61 yo male with PMH of HL, obesity, GERD and GI bleeding who presented with reports of chest pain.   1. Unstable Angina: Reports first developed while walking in the treadmill 2 saturdays ago. Has been having intermittent episodes with exertion and at rest. Does have concerning family hx of CAD with father/brother/sister all in their 61s with dx.  -- Trop 0.26, EKG with T wave changes in the inferiolateral leads  -- cycle enzymes  -- Will plan for transfer to [facility] for cardiac catheterization -- add heparin (does have hx of GI bleeding but FOBT negative today, Hgb stable) -- add low dose BB and statin  2. Hx of GI bleed: Currently being followed by GI in the outpatient setting. FOBT was negative today, Hgb stable.   3. GERD: continue Protonix    Signed, Reino Bellis, NP-C Pager [phone] 01/20/2017, 12:15 PM   I have examined the patient and reviewed assessment and plan and discussed with patient.  Agree with above as stated.  I spoke to the patient and wife at  length.  Patient with several RF for CAD.  Acute cotronary syndrome with increased troponin after having the flu a few weeks ago.  Given family history and elevated troponin, we agreed that cardiac cath would be the best test.  He is hoping not to get a stent "just for the sake of getting a stent." I explained to him that the rationale for a stent would be to reduce the risk of recurrent MI.  He is in agreement.  He has had a severe ulcer a few years ago requiring transfusion.  He has had some black stool in the past few days.  Hemoccult in the ER was negative.  WOuld consider Synergy stent to allow for reduction in the time course of DAPT.  OK to start heparin in the ER.  THis was not started for concern about GI bleeding. We will transfer [facility].  I discussed the risks and benefits of cardiac cath with the patient.  All questions were answered.  He agrees to proceed.  Larae Grooms

## 2017-01-21 ENCOUNTER — Encounter (HOSPITAL_COMMUNITY): Payer: Self-pay | Admitting: Cardiovascular Disease

## 2017-01-21 ENCOUNTER — Inpatient Hospital Stay (HOSPITAL_COMMUNITY): Payer: BLUE CROSS/BLUE SHIELD

## 2017-01-21 DIAGNOSIS — I214 Non-ST elevation (NSTEMI) myocardial infarction: Principal | ICD-10-CM

## 2017-01-21 DIAGNOSIS — Z01810 Encounter for preprocedural cardiovascular examination: Secondary | ICD-10-CM

## 2017-01-21 DIAGNOSIS — Z8249 Family history of ischemic heart disease and other diseases of the circulatory system: Secondary | ICD-10-CM

## 2017-01-21 DIAGNOSIS — I25110 Atherosclerotic heart disease of native coronary artery with unstable angina pectoris: Secondary | ICD-10-CM

## 2017-01-21 LAB — PULMONARY FUNCTION TEST
FEF 25-75 PRE: 3.54 L/s
FEF 25-75 Post: 4.68 L/sec
FEF2575-%Change-Post: 32 %
FEF2575-%PRED-POST: 155 %
FEF2575-%Pred-Pre: 117 %
FEV1-%CHANGE-POST: 8 %
FEV1-%Pred-Post: 96 %
FEV1-%Pred-Pre: 88 %
FEV1-PRE: 3.23 L
FEV1-Post: 3.51 L
FEV1FVC-%CHANGE-POST: 5 %
FEV1FVC-%Pred-Pre: 110 %
FEV6-%CHANGE-POST: 3 %
FEV6-%PRED-PRE: 82 %
FEV6-%Pred-Post: 85 %
FEV6-PRE: 3.79 L
FEV6-Post: 3.95 L
FEV6FVC-%Change-Post: 0 %
FEV6FVC-%PRED-PRE: 103 %
FEV6FVC-%Pred-Post: 104 %
FVC-%CHANGE-POST: 3 %
FVC-%PRED-POST: 82 %
FVC-%PRED-PRE: 79 %
FVC-POST: 3.95 L
FVC-Pre: 3.83 L
POST FEV6/FVC RATIO: 100 %
PRE FEV6/FVC RATIO: 99 %
Post FEV1/FVC ratio: 89 %
Pre FEV1/FVC ratio: 84 %

## 2017-01-21 LAB — BLOOD GAS, ARTERIAL
Acid-Base Excess: 1.8 mmol/L (ref 0.0–2.0)
Bicarbonate: 25.4 mmol/L (ref 20.0–28.0)
Drawn by: 406621
O2 Content: 21 L/min
O2 SAT: 95.6 %
PCO2 ART: 36.7 mmHg (ref 32.0–48.0)
PH ART: 7.455 — AB (ref 7.350–7.450)
PO2 ART: 78.3 mmHg — AB (ref 83.0–108.0)
Patient temperature: 98.6

## 2017-01-21 LAB — TSH: TSH: 1.098 u[IU]/mL (ref 0.350–4.500)

## 2017-01-21 LAB — BASIC METABOLIC PANEL
Anion gap: 8 (ref 5–15)
BUN: 18 mg/dL (ref 6–20)
CO2: 25 mmol/L (ref 22–32)
CREATININE: 0.92 mg/dL (ref 0.61–1.24)
Calcium: 8.9 mg/dL (ref 8.9–10.3)
Chloride: 104 mmol/L (ref 101–111)
GFR calc Af Amer: >60 mL/min (ref >60)
Glucose, Bld: 119 mg/dL — ABNORMAL HIGH (ref 65–99)
Potassium: 4.4 mmol/L (ref 3.5–5.1)
SODIUM: 137 mmol/L (ref 135–145)

## 2017-01-21 LAB — LIPID PANEL
CHOLESTEROL: 184 mg/dL (ref 0–200)
HDL: 43 mg/dL (ref >40)
LDL CALC: 128 mg/dL — AB (ref 0–99)
TRIGLYCERIDES: 64 mg/dL (ref <150)
Total CHOL/HDL Ratio: 4.3 RATIO
VLDL: 13 mg/dL (ref 0–40)

## 2017-01-21 LAB — VAS US DOPPLER PRE CABG
LEFT ECA DIAS: -18 cm/s
LEFT VERTEBRAL DIAS: -23 cm/s
Left CCA dist dias: 26 cm/s
Left CCA dist sys: 59 cm/s
Left CCA prox dias: 24 cm/s
Left CCA prox sys: 80 cm/s
Left ICA dist dias: -29 cm/s
Left ICA dist sys: -76 cm/s
Left ICA prox dias: -32 cm/s
Left ICA prox sys: -65 cm/s
RIGHT ECA DIAS: -21 cm/s
RIGHT VERTEBRAL DIAS: 13 cm/s
Right CCA prox dias: 19 cm/s
Right CCA prox sys: 73 cm/s
Right cca dist sys: -65 cm/s

## 2017-01-21 LAB — CBC
HCT: 41.9 % (ref 39.0–52.0)
Hemoglobin: 14.2 g/dL (ref 13.0–17.0)
MCH: 29.9 pg (ref 26.0–34.0)
MCHC: 33.9 g/dL (ref 30.0–36.0)
MCV: 88.2 fL (ref 78.0–100.0)
Platelets: 221 10*3/uL (ref 150–400)
RBC: 4.75 MIL/uL (ref 4.22–5.81)
RDW: 12.9 % (ref 11.5–15.5)
WBC: 10.3 10*3/uL (ref 4.0–10.5)

## 2017-01-21 LAB — ABO/RH: ABO/RH(D): O POS

## 2017-01-21 LAB — POC OCCULT BLOOD, ED: Fecal Occult Bld: POSITIVE — AB

## 2017-01-21 LAB — PREPARE RBC (CROSSMATCH)

## 2017-01-21 LAB — PROTIME-INR
INR: 1.13
Prothrombin Time: 14.6 seconds (ref 11.4–15.2)

## 2017-01-21 LAB — HIV ANTIBODY (ROUTINE TESTING W REFLEX): HIV SCREEN 4TH GENERATION: NONREACTIVE

## 2017-01-21 LAB — TROPONIN I
TROPONIN I: 3.45 ng/mL — AB (ref <0.03)
TROPONIN I: 1.4 ng/mL — AB (ref <0.03)
Troponin I: 2.24 ng/mL (ref <0.03)
Troponin I: 1.82 ng/mL (ref <0.03)

## 2017-01-21 LAB — SURGICAL PCR SCREEN
MRSA, PCR: NEGATIVE
Staphylococcus aureus: NEGATIVE

## 2017-01-21 LAB — APTT: aPTT: 65 seconds — ABNORMAL HIGH (ref 24–36)

## 2017-01-21 LAB — HEPARIN LEVEL (UNFRACTIONATED)
HEPARIN UNFRACTIONATED: 0.27 [IU]/mL — AB (ref 0.30–0.70)
HEPARIN UNFRACTIONATED: 0.51 [IU]/mL (ref 0.30–0.70)

## 2017-01-21 MED ORDER — TRANEXAMIC ACID (OHS) PUMP PRIME SOLUTION
2.0000 mg/kg | INTRAVENOUS | Status: DC
  Filled 2017-01-21: qty 2.16

## 2017-01-21 MED ORDER — CHLORHEXIDINE GLUCONATE 0.12 % MT SOLN
15.0000 mL | Freq: Once | OROMUCOSAL | Status: AC
  Administered 2017-01-22: 15 mL via OROMUCOSAL
  Filled 2017-01-21: qty 15

## 2017-01-21 MED ORDER — SODIUM CHLORIDE 0.9 % IV SOLN
30.0000 ug/min | INTRAVENOUS | Status: AC
  Administered 2017-01-22: 30 ug/min via INTRAVENOUS
  Filled 2017-01-21: qty 2

## 2017-01-21 MED ORDER — DEXTROSE 5 % IV SOLN
1.5000 g | INTRAVENOUS | Status: AC
  Administered 2017-01-22: .75 g via INTRAVENOUS
  Administered 2017-01-22: 1.5 g via INTRAVENOUS
  Filled 2017-01-21: qty 1.5

## 2017-01-21 MED ORDER — NITROGLYCERIN IN D5W 200-5 MCG/ML-% IV SOLN
2.0000 ug/min | INTRAVENOUS | Status: AC
  Administered 2017-01-22: 5 ug/min via INTRAVENOUS
  Filled 2017-01-21: qty 250

## 2017-01-21 MED ORDER — ALBUTEROL SULFATE (2.5 MG/3ML) 0.083% IN NEBU
2.5000 mg | INHALATION_SOLUTION | Freq: Once | RESPIRATORY_TRACT | Status: AC
  Administered 2017-01-21: 2.5 mg via RESPIRATORY_TRACT

## 2017-01-21 MED ORDER — TRANEXAMIC ACID (OHS) BOLUS VIA INFUSION
15.0000 mg/kg | INTRAVENOUS | Status: AC
  Administered 2017-01-22: 1618.5 mg via INTRAVENOUS
  Filled 2017-01-21: qty 1619

## 2017-01-21 MED ORDER — TEMAZEPAM 15 MG PO CAPS: 15.0000 mg | ORAL_CAPSULE | Freq: Once | ORAL | Status: DC | PRN

## 2017-01-21 MED ORDER — DEXTROSE 5 % IV SOLN
750.0000 mg | INTRAVENOUS | Status: DC
  Filled 2017-01-21: qty 750

## 2017-01-21 MED ORDER — CHLORHEXIDINE GLUCONATE 4 % EX LIQD
60.0000 mL | Freq: Once | CUTANEOUS | Status: AC
  Administered 2017-01-22: 4 via TOPICAL
  Filled 2017-01-21: qty 60

## 2017-01-21 MED ORDER — DOPAMINE-DEXTROSE 3.2-5 MG/ML-% IV SOLN
0.0000 ug/kg/min | INTRAVENOUS | Status: DC
  Filled 2017-01-21: qty 250

## 2017-01-21 MED ORDER — SODIUM CHLORIDE 0.9 % IV SOLN
INTRAVENOUS | Status: DC
  Filled 2017-01-21: qty 30

## 2017-01-21 MED ORDER — PLASMA-LYTE 148 IV SOLN
INTRAVENOUS | Status: AC
  Administered 2017-01-22: 500 mL
  Filled 2017-01-21: qty 2.5

## 2017-01-21 MED ORDER — ALPRAZOLAM 0.25 MG PO TABS: 0.2500 mg | ORAL_TABLET | ORAL | Status: DC | PRN

## 2017-01-21 MED ORDER — POTASSIUM CHLORIDE 2 MEQ/ML IV SOLN
80.0000 meq | INTRAVENOUS | Status: DC
  Filled 2017-01-21: qty 40

## 2017-01-21 MED ORDER — VANCOMYCIN HCL 10 G IV SOLR
1500.0000 mg | INTRAVENOUS | Status: AC
  Administered 2017-01-22: 1500 mg via INTRAVENOUS
  Filled 2017-01-21: qty 1500

## 2017-01-21 MED ORDER — CHLORHEXIDINE GLUCONATE 4 % EX LIQD
60.0000 mL | Freq: Once | CUTANEOUS | Status: AC
  Administered 2017-01-21: 4 via TOPICAL
  Filled 2017-01-21: qty 60

## 2017-01-21 MED ORDER — BISACODYL 5 MG PO TBEC
5.0000 mg | DELAYED_RELEASE_TABLET | Freq: Once | ORAL | Status: AC
  Administered 2017-01-21: 5 mg via ORAL
  Filled 2017-01-21: qty 1

## 2017-01-21 MED ORDER — EPINEPHRINE PF 1 MG/ML IJ SOLN
0.0000 ug/min | INTRAMUSCULAR | Status: DC
  Filled 2017-01-21: qty 4

## 2017-01-21 MED ORDER — TRANEXAMIC ACID 1000 MG/10ML IV SOLN
1.5000 mg/kg/h | INTRAVENOUS | Status: AC
  Administered 2017-01-22: 1.5 mg/kg/h via INTRAVENOUS
  Filled 2017-01-21: qty 25

## 2017-01-21 MED ORDER — SODIUM CHLORIDE 0.9 % IV SOLN
INTRAVENOUS | Status: AC
  Administered 2017-01-22: 1 [IU]/h via INTRAVENOUS
  Filled 2017-01-21: qty 2.5

## 2017-01-21 MED ORDER — DIAZEPAM 5 MG PO TABS
5.0000 mg | ORAL_TABLET | Freq: Once | ORAL | Status: AC
  Administered 2017-01-22: 5 mg via ORAL
  Filled 2017-01-21: qty 1

## 2017-01-21 MED ORDER — MAGNESIUM SULFATE 50 % IJ SOLN
40.0000 meq | INTRAMUSCULAR | Status: DC
  Filled 2017-01-21: qty 10

## 2017-01-21 MED ORDER — DEXMEDETOMIDINE HCL IN NACL 400 MCG/100ML IV SOLN
0.1000 ug/kg/h | INTRAVENOUS | Status: AC
Start: 2017-01-22 — End: 2017-01-22
  Administered 2017-01-22: 0.7 ug/kg/h via INTRAVENOUS
  Filled 2017-01-21: qty 100

## 2017-01-21 MED ORDER — METOPROLOL TARTRATE 12.5 MG HALF TABLET
12.5000 mg | ORAL_TABLET | Freq: Once | ORAL | Status: AC
  Administered 2017-01-22: 12.5 mg via ORAL
  Filled 2017-01-21: qty 1

## 2017-01-21 NOTE — Progress Notes (Signed)
Progress Note  Patient Name: [name] Date of Encounter: 01/21/2017  Primary Cardiologist: Irish Lack  Subjective   No further CP.  Spoke with Dr. Prescott Gum yesterday.  Inpatient Medications    Scheduled Meds: . aspirin EC  81 mg Oral Daily  . atorvastatin  40 mg Oral q1800  . bisacodyl  5 mg Oral Once  . chlorhexidine  60 mL Topical Once   And  . [START ON 01/22/2017] chlorhexidine  60 mL Topical Once  . [START ON 01/22/2017] chlorhexidine  15 mL Mouth/Throat Once  . [START ON 01/22/2017] diazepam  5 mg Oral Once  . metoprolol tartrate  12.5 mg Oral BID  . [START ON 01/22/2017] metoprolol tartrate  12.5 mg Oral Once  . pantoprazole  40 mg Oral BID  . sodium chloride flush  3 mL Intravenous Q12H   Continuous Infusions: . heparin 1,350 Units/hr (01/21/17 0559)   PRN Meds: sodium chloride, sodium chloride, acetaminophen, ALPRAZolam, nitroGLYCERIN, ondansetron (ZOFRAN) IV, sodium chloride flush, temazepam   Vital Signs    Vitals:   01/20/17 2045 01/20/17 2110 01/21/17 0425 01/21/17 0859  BP:  (!) 140/99 132/89 122/80  Pulse: 88 85 79 81  Resp:  18 16   Temp:  97.9 F (36.6 C) 98 F (36.7 C)   TempSrc:  Oral Oral   SpO2: 97% 97% 94%   Weight:   237 lb 14.4 oz (107.9 kg)   Height:        Intake/Output Summary (Last 24 hours) at 01/21/17 1154 Last data filed at 01/21/17 0934  Gross per 24 hour  Intake          1722.18 ml  Output                0 ml  Net          1722.18 ml   Filed Weights   01/20/17 1800 01/21/17 0425  Weight: 240 lb (108.9 kg) 237 lb 14.4 oz (107.9 kg)    Telemetry    NSR - Personally Reviewed  ECG    NSR, inferolateral ST changes - Personally Reviewed  Physical Exam   GEN: No acute distress.   Neck: No JVD Cardiac: RRR, no murmurs, rubs, or gallops.  Respiratory: Clear to auscultation bilaterally. GI: Soft, nontender, non-distended  MS: No edema; No deformity. Neuro:  Nonfocal  Psych: Normal affect   Labs     Chemistry Recent Labs Lab 01/20/17 0813 01/21/17 0206  NA 136 137  K 4.4 4.4  CL 102 104  CO2 27 25  GLUCOSE 108* 119*  BUN 24* 18  CREATININE 0.91 0.92  CALCIUM 9.2 8.9  PROT 7.4  --   ALBUMIN 4.2  --   AST 29  --   ALT 22  --   ALKPHOS 53  --   BILITOT 1.1  --   GFRNONAA >60 >60  GFRAA >60 >60  ANIONGAP 7 8     Hematology Recent Labs Lab 01/20/17 0813 01/21/17 0206  WBC 10.0 10.3  RBC 5.13 4.75  HGB 15.3 14.2  HCT 44.7 41.9  MCV 87.1 88.2  MCH 29.8 29.9  MCHC 34.2 33.9  RDW 12.9 12.9  PLT 247 221    Cardiac Enzymes Recent Labs Lab 01/20/17 1334 01/20/17 2046 01/21/17 0206 01/21/17 0801  TROPONINI 4.89* 3.72* 3.45* 2.24*    Recent Labs Lab 01/20/17 0819  TROPIPOC 0.26*     BNPNo results for input(s): BNP, PROBNP in the last 168 hours.  DDimer No results for input(s): DDIMER in the last 168 hours.   Radiology    Dg Chest 2 View  Result Date: 01/20/2017 CLINICAL DATA:  61 year old male with left mid chest pain for 2 days. Recent flu diagnosis with some cough. Initial encounter. EXAM: CHEST  2 VIEW COMPARISON:  None. FINDINGS: Mildly low lung volumes. Normal cardiac size and mediastinal contours. Visualized tracheal air column is within normal limits. No pneumothorax, pulmonary edema, pleural effusion or consolidation. There is mild linear opacity at the left lung base. No other confluent pulmonary opacity. No acute osseous abnormality identified. Negative visible bowel gas pattern. IMPRESSION: Mild linear opacity at the left lung base most resembles atelectasis. No other acute cardiopulmonary abnormality. Electronically Signed   By: Genevie Ann M.D.   On: 01/20/2017 08:10    Cardiac Studies   Cath films reviewed personally- 3 vessel disease  Patient Profile     61 y.o. male with multivessel CAD  Assessment & Plan    Cardiac surgery eval in progress.  Plan for surgery tomorrow.  Will need aspirin, statin and beta blocker post op.   All  questions answered. He will need aggressive secondary prevention.  Signed, Larae Grooms, MD  01/21/2017, 11:54 AM

## 2017-01-21 NOTE — Progress Notes (Signed)
ANTICOAGULATION CONSULT NOTE - Follow Up Consult  Pharmacy Consult for Heparin  Indication: Severe multi-vessel disease awaiting CVTS consult  Allergies  Allergen Reactions  . Ibuprofen Other (See Comments)    Severe GI Bleed and PUD     Patient Measurements: Height: 5\' 10"  (177.8 cm) Weight: 237 lb 14.4 oz (107.9 kg) IBW/kg (Calculated) : 73  Vital Signs: Temp: 97.8 F (36.6 C) (02/28 1300) Temp Source: Oral (02/28 1300) BP: 124/81 (02/28 1300) Pulse Rate: 77 (02/28 1300)  Labs:  Recent Labs  01/20/17 0813  01/20/17 2046 01/21/17 0206 01/21/17 0402 01/21/17 0801 01/21/17 1501  HGB 15.3  --   --  14.2  --   --   --   HCT 44.7  --   --  41.9  --   --   --   PLT 247  --   --  221  --   --   --   APTT  --   --   --   --   --  65*  --   LABPROT  --   --   --  14.6  --   --   --   INR  --   --   --  1.13  --   --   --   HEPARINUNFRC  --   --   --   --  0.27*  --  0.51  CREATININE 0.91  --   --  0.92  --   --   --   TROPONINI  --   < > 3.72* 3.45*  --  2.24*  --   < > = values in this interval not displayed.  Estimated Creatinine Clearance: 106.4 mL/min (by C-G formula based on SCr of 0.92 mg/dL).   Assessment: Therapeutic heparin level (0.51)  after rate increased to 1350 units/hr. CBC stable, no bleeding reported. For CABG in AM.   Goal of Therapy:  Heparin level 0.3-0.7 units/ml Monitor platelets by anticoagulation protocol: Yes   Plan:   continue heparin at 1350 units/hr Heparin to be turned off on call to OR in am  Eudelia Bunch, Pharm.D. [phone] 01/21/2017 4:00 PM

## 2017-01-21 NOTE — Progress Notes (Addendum)
CARDIAC REHAB PHASE I   Pt has been walking independently, no CP. Discussed sternal precautions, IS, mobility post op, and d/c planning. Voiced understanding. Gave OHS booklet, careguide, and video to watch. Pt very motivated. Will be eager to start CRPII post op. Wife will be with him at d/c. There are no IS here on the unit so pt will need one when he goes to PFTs this pm. [place]:[id number] [place], ACSM 01/21/2017 10:15 AM

## 2017-01-21 NOTE — Progress Notes (Signed)
ANTICOAGULATION CONSULT NOTE - Follow Up Consult  Pharmacy Consult for Heparin  Indication: Severe multi-vessel disease awaiting CVTS consult  Allergies  Allergen Reactions  . Ibuprofen Other (See Comments)    Severe GI Bleed and PUD     Patient Measurements: Height: 5\' 10"  (177.8 cm) Weight: 237 lb 14.4 oz (107.9 kg) IBW/kg (Calculated) : 73  Vital Signs: Temp: 98 F (36.7 C) (02/28 0425) Temp Source: Oral (02/28 0425) BP: 132/89 (02/28 0425) Pulse Rate: 79 (02/28 0425)  Labs:  Recent Labs  01/20/17 0813 01/20/17 1334 01/20/17 2046 01/21/17 0206 01/21/17 0402  HGB 15.3  --   --  14.2  --   HCT 44.7  --   --  41.9  --   PLT 247  --   --  221  --   LABPROT  --   --   --  14.6  --   INR  --   --   --  1.13  --   HEPARINUNFRC  --   --   --   --  0.27*  CREATININE 0.91  --   --  0.92  --   TROPONINI  --  4.89* 3.72* 3.45*  --     Estimated Creatinine Clearance: 106.4 mL/min (by C-G formula based on SCr of 0.92 mg/dL).   Assessment: Sub-therapeutic heparin level s/p cath, no issues per RN, awaiting CVTS consult  Goal of Therapy:  Heparin level 0.3-0.7 units/ml Monitor platelets by anticoagulation protocol: Yes   Plan:  -Inc heparin to 1350 units/hr -1400 HL  Narda Bonds 01/21/2017,6:00 AM

## 2017-01-21 NOTE — Progress Notes (Signed)
ABG collected per MD order on Room Air. Results within normal limits. RT will continue to monitor.

## 2017-01-21 NOTE — Consult Note (Signed)
[address]       [place] [address]             [phone]        [facility] [employer] Medical Record [id number] Date of Birth: 1956-06-01  Referring: No ref. provider found Primary Care: No PCP Per Patient  Chief Complaint:    Chief Complaint  Patient presents with  . Chest Pain  Patient examined, cardiac catheterization and chest x-ray personally reviewed and discussed with patient   History of Present Illness:     61 year old Caucasian male without prior history of cardiac disease presents with symptoms of unstable angina which have increased in intensity and frequency over the past 2 weeks. He presented to the emergency department after early morning resting chest pain. EKG was nonspecific ST segment changes in the inferior lead but cardiac enzymes were mildly positive with troponin 0.26. The patient was omitted to the hospital and underwent cardiac catheterization via right radial artery. He was found have severe multivessel coronary artery disease with mild-moderate LV dysfunction. His cardiologist has recommended multivessel CABG and CT surgical evaluation has been requested.  The patient had a history of severe GI bleeding requiring transfusion from gastric erosions  related to NSAIDs 2016 but this has been resolved by gastroenterology and long-term use of protonix . He denies recent hematochezia or melena  Patient currently is stable on IV heparin, sinus rhythm, stable blood pressure  There is strong family history of CAD requiring intervention with stent therapy. 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   Zubrod Score: At the time of surgery this patient's most appropriate activity status/level should be described as: []     0    Normal activity, no symptoms [x]     1    Restricted in physical strenuous activity but ambulatory, able to do out light work []     2     Ambulatory and capable of self care, unable to do work activities, up and about                 more than 50%  Of the time                            []     3    Only limited self care, in bed greater than 50% of waking hours []     4    Completely disabled, no self care, confined to bed or chair []     5    Moribund  Past Medical History:  Diagnosis Date  . Adenomatous colon polyp   . Gastric erosions   . Gastric polyps   . Gastric ulcer   . GERD (gastroesophageal reflux disease)   . GI bleed due to NSAIDs   . HLD (hyperlipidemia)     Past Surgical History:  Procedure Laterality Date  . ESOPHAGOGASTRODUODENOSCOPY N/A 08/24/2014   Procedure: ESOPHAGOGASTRODUODENOSCOPY (EGD);  Surgeon: Jerene Bears, MD;  Location: [facility];  Service: Endoscopy;  Laterality: N/A;  . LEFT HEART CATH AND CORONARY ANGIOGRAPHY N/A 01/20/2017   Procedure: Left Heart Cath and Coronary Angiography;  Surgeon: Sherren Mocha, MD;  Location: [facility];  Service: Cardiovascular;  Laterality: N/A;    History  Smoking Status  . Never Smoker  Smokeless Tobacco  . Never Used    History  Alcohol Use  No    Social History   Social History  . Marital status: Married    Spouse name: N/A  . Number of children: N/A  . Years of education: N/A   Occupational History  . Not on file.   Social History Main Topics  . Smoking status: Never Smoker  . Smokeless tobacco: Never Used  . Alcohol use No  . Drug use: No  . Sexual activity: Yes   Other Topics Concern  . Not on file   Social History Narrative  . No narrative on file    Allergies  Allergen Reactions  . Ibuprofen Other (See Comments)    Severe GI Bleed and PUD      Current Facility-Administered Medications  Medication Dose Route Frequency Provider Last Rate Last Dose  . 0.9 %  sodium chloride infusion  250 mL Intravenous PRN Cheryln Manly, NP      . 0.9 %  sodium chloride infusion  250 mL Intravenous PRN Sherren Mocha, MD        . acetaminophen (TYLENOL) tablet 650 mg  650 mg Oral Q4H PRN Cheryln Manly, NP      . aspirin EC tablet 81 mg  81 mg Oral Daily Cheryln Manly, NP   81 mg at 01/21/17 0901  . atorvastatin (LIPITOR) tablet 40 mg  40 mg Oral q1800 Cheryln Manly, NP   40 mg at 01/20/17 1659  . heparin ADULT infusion 100 units/mL (25000 units/232mL sodium chloride 0.45%)  1,350 Units/hr Intravenous Continuous Erenest Blank, RPH 13.5 mL/hr at 01/21/17 0559 1,350 Units/hr at 01/21/17 0559  . metoprolol tartrate (LOPRESSOR) tablet 12.5 mg  12.5 mg Oral BID Cheryln Manly, NP   12.5 mg at 01/21/17 0900  . nitroGLYCERIN (NITROSTAT) SL tablet 0.4 mg  0.4 mg Sublingual Q5 Min x 3 PRN Cheryln Manly, NP      . ondansetron [facility]) injection 4 mg  4 mg Intravenous Q6H PRN Cheryln Manly, NP      . pantoprazole (PROTONIX) EC tablet 40 mg  40 mg Oral BID Ivin Poot, MD   40 mg at 01/21/17 0859  . sodium chloride flush (NS) 0.9 % injection 3 mL  3 mL Intravenous Q12H Cheryln Manly, NP      . sodium chloride flush (NS) 0.9 % injection 3 mL  3 mL Intravenous PRN Cheryln Manly, NP        Prescriptions Prior to Admission  Medication Sig Dispense Refill Last Dose  . Coenzyme Q10 (CO Q 10 PO) Take 1 tablet by mouth daily.   Past Month at Unknown time  . Omega-3 Fatty Acids (OMEGA 3 PO) Take 2 capsules by mouth daily.   Past Week at Unknown time  . OVER THE COUNTER MEDICATION Take 3 tablets by mouth daily. Nitrulline: Nitric Oxide Potentiator supplement.   01/19/2017 at Unknown time  . pantoprazole (PROTONIX) 40 MG tablet Take 1 tab every morning , daily. 90 tablet 3 01/19/2017 at Unknown time  . PRESCRIPTION MEDICATION Inject 1 Dose into the muscle once. Per pt: received one dose of steroid shot from urgent care on [address] in [place].   01/17/2017  . PRESCRIPTION MEDICATION Inject 1 Dose into the muscle once. Per pt: received 1 penicillin injection at urgent on [address] in [facility]    01/17/2017  . ZINC OXIDE PO Take 1 tablet by mouth daily.   Past Week at Unknown time  . doxycycline (VIBRAMYCIN) 100 MG capsule Take  100 mg by mouth 2 (two) times daily.    Completed Course at Unknown time  . oseltamivir (TAMIFLU) 75 MG capsule Take 75 mg by mouth 2 (two) times daily.    Completed Course at Unknown time    Family History  Problem Relation Age of Onset  . Heart disease Father   . Diabetes Father   . Heart disease Sister   . Heart disease Brother   . Liver cancer Maternal Grandmother   . Esophageal cancer Paternal Grandmother      Review of Systems:       Cardiac Review of Systems: Y or N  Chest Pain [ Yes   ]  Resting SOB [   ] Exertional SOB  [  yes]  Orthopnea [  ]   Pedal Edema [   ]    Palpitations [  ] Syncope  [  ]   Presyncope [   ]  General Review of Systems: [Y] = yes [  ]=no Constitional: recent weight change [  ]; anorexia [  ]; fatigue [  ]; nausea [  ]; night sweats [  ]; fever [  ]; or chills [  ]                                                               Dental: poor dentition[  ]; Last Dentist visit: 6 months  Eye : blurred vision [  ]; diplopia [   ]; vision changes [  ];  Amaurosis fugax[  ]; Resp: cough [  ];  wheezing[  ];  hemoptysis[  ]; shortness of breath[  ]; paroxysmal nocturnal dyspnea[  ]; dyspnea on exertion[ yes ]; or orthopnea[  ];  GI:  gallstones[  ], vomiting[  ];  dysphagia[  ]; melena[ in past  ];  hematochezia [ in past ]; heartburn[ yes ];   Hx of  Colonoscopy[ yes and upper endoscopy  ]; GU: kidney stones [  ]; hematuria[  ];   dysuria [  ];  nocturia[  ];  history of     obstruction [  ]; urinary frequency [  ]             Skin: rash, swelling[  ];, hair loss[  ];  peripheral edema[  ];  or itching[  ]; Musculosketetal: myalgias[  ];  joint swelling[  ];  joint erythema[  ];  joint pain[  ];  back pain[ yes ];  Heme/Lymph: bruising[  ];  bleeding[  ];  anemia[  ];  Neuro: TIA[  ];  headaches[  ];  stroke[  ];  vertigo[  ];   seizures[  ];   paresthesias[  ];  difficulty walking[  ];  Psych:depression[  ]; anxiety[  ];  Endocrine: diabetes[  ];  thyroid dysfunction[  ];  Immunizations: Flu [  ]; Pneumococcal[  ];  Other: Right-hand dominant, no history of phlebitis or varicose veins of the legs  Physical Exam: BP 122/80   Pulse 81   Temp 98 F (36.7 C) (Oral)   Resp 16   Ht 5\' 10"  (1.778 m)   Wt 237 lb 14.4 oz (107.9 kg)   SpO2 94%   BMI 34.14 kg/m        Physical  Exam  General: Overweight middle-aged Caucasian male no acute distress HEENT: Normocephalic pupils equal , dentition adequate Neck: Supple without JVD, adenopathy, or bruit Chest: Clear to auscultation, symmetrical breath sounds, no rhonchi, no tenderness             or deformity Cardiovascular: Regular rate and rhythm, no murmur, no gallop, peripheral pulses             palpable in all extremities Abdomen:  Soft, nontender, no palpable mass or organomegaly Extremities: Warm, well-perfused, no clubbing cyanosis edema or tenderness, right wrist with recent radial artery puncture no hematoma              no venous stasis changes of the legs Rectal/GU: Deferred Neuro: Grossly non--focal and symmetrical throughout Skin: Clean and dry without rash or ulceration   Diagnostic Studies & Laboratory data:     Recent Radiology Findings:   Dg Chest 2 View  Result Date: 01/20/2017 CLINICAL DATA:  61 year old male with left mid chest pain for 2 days. Recent flu diagnosis with some cough. Initial encounter. EXAM: CHEST  2 VIEW COMPARISON:  None. FINDINGS: Mildly low lung volumes. Normal cardiac size and mediastinal contours. Visualized tracheal air column is within normal limits. No pneumothorax, pulmonary edema, pleural effusion or consolidation. There is mild linear opacity at the left lung base. No other confluent pulmonary opacity. No acute osseous abnormality identified. Negative visible bowel gas pattern. IMPRESSION: Mild linear opacity at the  left lung base most resembles atelectasis. No other acute cardiopulmonary abnormality. Electronically Signed   By: Genevie Ann M.D.   On: 01/20/2017 08:10     I have independently reviewed the above radiologic studies.  Recent Lab Findings: Lab Results  Component Value Date   WBC 10.3 01/21/2017   HGB 14.2 01/21/2017   HCT 41.9 01/21/2017   PLT 221 01/21/2017   GLUCOSE 119 (H) 01/21/2017   CHOL 184 01/21/2017   TRIG 64 01/21/2017   HDL 43 01/21/2017   LDLDIRECT 161.3 10/03/2008   LDLCALC 128 (H) 01/21/2017   ALT 22 01/20/2017   AST 29 01/20/2017   NA 137 01/21/2017   K 4.4 01/21/2017   CL 104 01/21/2017   CREATININE 0.92 01/21/2017   BUN 18 01/21/2017   CO2 25 01/21/2017   TSH 1.098 01/21/2017   INR 1.13 01/21/2017      Assessment / Plan:     Unstable angina with non-ST elevation MI   Severe multivessel CAD with mild LV dysfunction   History of upper GI bleeding from gastric erosions now resolved after withdrawal of NSAIDs and use of protonix   Patient will benefit from multivessel CABG which will be scheduled for March 1. The procedure indications benefits and risks have been discussed with patient. He understands and agrees to proceed with procedure.     @ME1 @ 01/21/2017 10:52 AM

## 2017-01-21 NOTE — Progress Notes (Signed)
Pre-op Cardiac Surgery  Carotid Findings:   Findings are consistent with a 1-39 percent stenosis involving the right internal carotid artery and the left internal carotid artery. The vertebral artery demonstrates antegrade flow.  Upper Extremity Right Left  Brachial Pressures 130  Triphasic 136  Triphasic  Radial Waveforms Triphasic Triphasic  Ulnar Waveforms Triphasic Triphasic  Palmar Arch (Allen's Test) Palmar waveforms are obliterated with radial and ulnar compression. Palmar waveforms are decreased greater than fifty percent with radial compression and remains within normal limits with ulnar compression.    Lower  Extremity Right Left  Dorsalis Pedis 151 154  Posterior Tibial 159 155  Ankle/Brachial Indices 1.17 1.14   Findings:   Right ABI of 1.17 and left ABI of 1.14 are suggestive of arterial flow within normal limits at rest.

## 2017-01-22 ENCOUNTER — Inpatient Hospital Stay (HOSPITAL_COMMUNITY): Payer: BLUE CROSS/BLUE SHIELD | Admitting: Certified Registered Nurse Anesthetist

## 2017-01-22 ENCOUNTER — Encounter (HOSPITAL_COMMUNITY)
Admission: EM | Disposition: A | Discharge status: Home / Self Care | Payer: Self-pay | Source: Home / Self Care | Attending: Cardiothoracic Surgery

## 2017-01-22 ENCOUNTER — Inpatient Hospital Stay (HOSPITAL_COMMUNITY): Payer: BLUE CROSS/BLUE SHIELD

## 2017-01-22 HISTORY — PX: TEE WITHOUT CARDIOVERSION: SHX5443

## 2017-01-22 HISTORY — PX: CORONARY ARTERY BYPASS GRAFT: SHX141

## 2017-01-22 LAB — HEMOGLOBIN AND HEMATOCRIT, BLOOD
HCT: 30.3 % — ABNORMAL LOW (ref 39.0–52.0)
Hemoglobin: 10.3 g/dL — ABNORMAL LOW (ref 13.0–17.0)

## 2017-01-22 LAB — POCT I-STAT, CHEM 8
BUN: 20 mg/dL (ref 6–20)
BUN: 20 mg/dL (ref 6–20)
BUN: 18 mg/dL (ref 6–20)
BUN: 18 mg/dL (ref 6–20)
BUN: 16 mg/dL (ref 6–20)
CALCIUM ION: 1.23 mmol/L (ref 1.15–1.40)
CALCIUM ION: 1.18 mmol/L (ref 1.15–1.40)
CALCIUM ION: 1.11 mmol/L — AB (ref 1.15–1.40)
CHLORIDE: 104 mmol/L (ref 101–111)
CHLORIDE: 104 mmol/L (ref 101–111)
CHLORIDE: 100 mmol/L — AB (ref 101–111)
CHLORIDE: 101 mmol/L (ref 101–111)
CHLORIDE: 107 mmol/L (ref 101–111)
CREATININE: 0.8 mg/dL (ref 0.61–1.24)
CREATININE: 0.8 mg/dL (ref 0.61–1.24)
Calcium, Ion: 1.05 mmol/L — ABNORMAL LOW (ref 1.15–1.40)
Calcium, Ion: 1.07 mmol/L — ABNORMAL LOW (ref 1.15–1.40)
Creatinine, Ser: 0.7 mg/dL (ref 0.61–1.24)
Creatinine, Ser: 0.7 mg/dL (ref 0.61–1.24)
Creatinine, Ser: 0.7 mg/dL (ref 0.61–1.24)
GLUCOSE: 108 mg/dL — AB (ref 65–99)
GLUCOSE: 149 mg/dL — AB (ref 65–99)
GLUCOSE: 118 mg/dL — AB (ref 65–99)
Glucose, Bld: 105 mg/dL — ABNORMAL HIGH (ref 65–99)
Glucose, Bld: 162 mg/dL — ABNORMAL HIGH (ref 65–99)
HCT: 39 % (ref 39.0–52.0)
HCT: 27 % — ABNORMAL LOW (ref 39.0–52.0)
HCT: 32 % — ABNORMAL LOW (ref 39.0–52.0)
HCT: 29 % — ABNORMAL LOW (ref 39.0–52.0)
HEMATOCRIT: 37 % — AB (ref 39.0–52.0)
HEMOGLOBIN: 13.3 g/dL (ref 13.0–17.0)
Hemoglobin: 12.6 g/dL — ABNORMAL LOW (ref 13.0–17.0)
Hemoglobin: 9.2 g/dL — ABNORMAL LOW (ref 13.0–17.0)
Hemoglobin: 10.9 g/dL — ABNORMAL LOW (ref 13.0–17.0)
Hemoglobin: 9.9 g/dL — ABNORMAL LOW (ref 13.0–17.0)
POTASSIUM: 4.1 mmol/L (ref 3.5–5.1)
POTASSIUM: 4.5 mmol/L (ref 3.5–5.1)
POTASSIUM: 4.2 mmol/L (ref 3.5–5.1)
Potassium: 4.2 mmol/L (ref 3.5–5.1)
Potassium: 4.2 mmol/L (ref 3.5–5.1)
SODIUM: 138 mmol/L (ref 135–145)
SODIUM: 133 mmol/L — AB (ref 135–145)
Sodium: 138 mmol/L (ref 135–145)
Sodium: 137 mmol/L (ref 135–145)
Sodium: 140 mmol/L (ref 135–145)
TCO2: 29 mmol/L (ref 0–100)
TCO2: 27 mmol/L (ref 0–100)
TCO2: 25 mmol/L (ref 0–100)
TCO2: 23 mmol/L (ref 0–100)
TCO2: 21 mmol/L (ref 0–100)

## 2017-01-22 LAB — POCT I-STAT 3, ART BLOOD GAS (G3+)
Acid-Base Excess: 1 mmol/L (ref 0.0–2.0)
Acid-base deficit: 3 mmol/L — ABNORMAL HIGH (ref 0.0–2.0)
Acid-base deficit: 3 mmol/L — ABNORMAL HIGH (ref 0.0–2.0)
Acid-base deficit: 4 mmol/L — ABNORMAL HIGH (ref 0.0–2.0)
Acid-base deficit: 6 mmol/L — ABNORMAL HIGH (ref 0.0–2.0)
BICARBONATE: 21.6 mmol/L (ref 20.0–28.0)
Bicarbonate: 25.3 mmol/L (ref 20.0–28.0)
Bicarbonate: 22.3 mmol/L (ref 20.0–28.0)
Bicarbonate: 20.6 mmol/L (ref 20.0–28.0)
Bicarbonate: 18.7 mmol/L — ABNORMAL LOW (ref 20.0–28.0)
O2 SAT: 97 %
O2 SAT: 95 %
O2 Saturation: 100 %
O2 Saturation: 99 %
O2 Saturation: 96 %
PCO2 ART: 39.4 mmHg (ref 32.0–48.0)
PCO2 ART: 38.9 mmHg (ref 32.0–48.0)
PCO2 ART: 35 mmHg (ref 32.0–48.0)
PCO2 ART: 33.5 mmHg (ref 32.0–48.0)
PH ART: 7.366 (ref 7.350–7.450)
PO2 ART: 350 mmHg — AB (ref 83.0–108.0)
PO2 ART: 135 mmHg — AB (ref 83.0–108.0)
PO2 ART: 90 mmHg (ref 83.0–108.0)
Patient temperature: 35.8
TCO2: 26 mmol/L (ref 0–100)
TCO2: 23 mmol/L (ref 0–100)
TCO2: 23 mmol/L (ref 0–100)
TCO2: 22 mmol/L (ref 0–100)
TCO2: 20 mmol/L (ref 0–100)
pCO2 arterial: 36.2 mmHg (ref 32.0–48.0)
pH, Arterial: 7.414 (ref 7.350–7.450)
pH, Arterial: 7.392 (ref 7.350–7.450)
pH, Arterial: 7.362 (ref 7.350–7.450)
pH, Arterial: 7.357 (ref 7.350–7.450)
pO2, Arterial: 81 mmHg — ABNORMAL LOW (ref 83.0–108.0)
pO2, Arterial: 80 mmHg — ABNORMAL LOW (ref 83.0–108.0)

## 2017-01-22 LAB — CBC
HCT: 41.5 % (ref 39.0–52.0)
HCT: 33.6 % — ABNORMAL LOW (ref 39.0–52.0)
HCT: 31.2 % — ABNORMAL LOW (ref 39.0–52.0)
HEMOGLOBIN: 14.1 g/dL (ref 13.0–17.0)
HEMOGLOBIN: 11.3 g/dL — AB (ref 13.0–17.0)
Hemoglobin: 10.3 g/dL — ABNORMAL LOW (ref 13.0–17.0)
MCH: 29.9 pg (ref 26.0–34.0)
MCH: 29.4 pg (ref 26.0–34.0)
MCH: 29.1 pg (ref 26.0–34.0)
MCHC: 34 g/dL (ref 30.0–36.0)
MCHC: 33.6 g/dL (ref 30.0–36.0)
MCHC: 33 g/dL (ref 30.0–36.0)
MCV: 88.1 fL (ref 78.0–100.0)
MCV: 87.5 fL (ref 78.0–100.0)
MCV: 88.1 fL (ref 78.0–100.0)
PLATELETS: 219 10*3/uL (ref 150–400)
Platelets: 175 10*3/uL (ref 150–400)
Platelets: 148 10*3/uL — ABNORMAL LOW (ref 150–400)
RBC: 4.71 MIL/uL (ref 4.22–5.81)
RBC: 3.84 MIL/uL — AB (ref 4.22–5.81)
RBC: 3.54 MIL/uL — ABNORMAL LOW (ref 4.22–5.81)
RDW: 12.8 % (ref 11.5–15.5)
RDW: 12.8 % (ref 11.5–15.5)
RDW: 12.9 % (ref 11.5–15.5)
WBC: 10.7 10*3/uL — ABNORMAL HIGH (ref 4.0–10.5)
WBC: 23.3 10*3/uL — ABNORMAL HIGH (ref 4.0–10.5)
WBC: 16.3 10*3/uL — ABNORMAL HIGH (ref 4.0–10.5)

## 2017-01-22 LAB — POCT I-STAT 4, (NA,K, GLUC, HGB,HCT)
Glucose, Bld: 133 mg/dL — ABNORMAL HIGH (ref 65–99)
HCT: 33 % — ABNORMAL LOW (ref 39.0–52.0)
HEMOGLOBIN: 11.2 g/dL — AB (ref 13.0–17.0)
POTASSIUM: 4.2 mmol/L (ref 3.5–5.1)
SODIUM: 139 mmol/L (ref 135–145)

## 2017-01-22 LAB — BASIC METABOLIC PANEL
Anion gap: 6 (ref 5–15)
BUN: 18 mg/dL (ref 6–20)
CO2: 28 mmol/L (ref 22–32)
Calcium: 9 mg/dL (ref 8.9–10.3)
Chloride: 105 mmol/L (ref 101–111)
Creatinine, Ser: 1.04 mg/dL (ref 0.61–1.24)
GFR calc Af Amer: >60 mL/min (ref >60)
GFR calc non Af Amer: >60 mL/min (ref >60)
Glucose, Bld: 129 mg/dL — ABNORMAL HIGH (ref 65–99)
Potassium: 4.6 mmol/L (ref 3.5–5.1)
Sodium: 139 mmol/L (ref 135–145)

## 2017-01-22 LAB — GLUCOSE, CAPILLARY
GLUCOSE-CAPILLARY: 129 mg/dL — AB (ref 65–99)
GLUCOSE-CAPILLARY: 129 mg/dL — AB (ref 65–99)
GLUCOSE-CAPILLARY: 131 mg/dL — AB (ref 65–99)
Glucose-Capillary: 131 mg/dL — ABNORMAL HIGH (ref 65–99)
Glucose-Capillary: 133 mg/dL — ABNORMAL HIGH (ref 65–99)
Glucose-Capillary: 120 mg/dL — ABNORMAL HIGH (ref 65–99)
Glucose-Capillary: 147 mg/dL — ABNORMAL HIGH (ref 65–99)
Glucose-Capillary: 110 mg/dL — ABNORMAL HIGH (ref 65–99)

## 2017-01-22 LAB — CREATININE, SERUM
Creatinine, Ser: 0.92 mg/dL (ref 0.61–1.24)
GFR calc Af Amer: >60 mL/min (ref >60)
GFR calc non Af Amer: >60 mL/min (ref >60)

## 2017-01-22 LAB — PROTIME-INR
INR: 1.33
PROTHROMBIN TIME: 16.5 s — AB (ref 11.4–15.2)

## 2017-01-22 LAB — TROPONIN I: TROPONIN I: 1.68 ng/mL — AB (ref <0.03)

## 2017-01-22 LAB — PLATELET COUNT: Platelets: 191 10*3/uL (ref 150–400)

## 2017-01-22 LAB — MAGNESIUM: Magnesium: 3.1 mg/dL — ABNORMAL HIGH (ref 1.7–2.4)

## 2017-01-22 LAB — HEPARIN LEVEL (UNFRACTIONATED): Heparin Unfractionated: 0.51 IU/mL (ref 0.30–0.70)

## 2017-01-22 LAB — APTT: aPTT: 26 seconds (ref 24–36)

## 2017-01-22 SURGERY — CORONARY ARTERY BYPASS GRAFTING (CABG)
Anesthesia: General | Site: Chest

## 2017-01-22 MED ORDER — ALBUMIN HUMAN 5 % IV SOLN
INTRAVENOUS | Status: DC | PRN
  Administered 2017-01-22: 14:00:00 via INTRAVENOUS

## 2017-01-22 MED ORDER — TRAMADOL HCL 50 MG PO TABS
50.0000 mg | ORAL_TABLET | ORAL | Status: DC | PRN
  Administered 2017-01-24 – 2017-01-26 (×4): 100 mg via ORAL
  Administered 2017-01-27: 50 mg via ORAL
  Filled 2017-01-22: qty 1
  Filled 2017-01-22 (×4): qty 2

## 2017-01-22 MED ORDER — PROTAMINE SULFATE 10 MG/ML IV SOLN
INTRAVENOUS | Status: AC
  Filled 2017-01-22: qty 25

## 2017-01-22 MED ORDER — ONDANSETRON HCL 4 MG/2ML IJ SOLN
4.0000 mg | Freq: Four times a day (QID) | INTRAMUSCULAR | Status: DC | PRN
Start: 2017-01-22 — End: 2017-01-28

## 2017-01-22 MED ORDER — SODIUM CHLORIDE 0.9 % IV SOLN: 250.0000 mL | INTRAVENOUS | Status: DC

## 2017-01-22 MED ORDER — ACETAMINOPHEN 160 MG/5ML PO SOLN: 650.0000 mg | Freq: Once | ORAL | Status: AC

## 2017-01-22 MED ORDER — MILRINONE LACTATE IN DEXTROSE 20-5 MG/100ML-% IV SOLN
0.1250 ug/kg/min | INTRAVENOUS | Status: DC
  Administered 2017-01-22: 0.25 ug/kg/min via INTRAVENOUS
  Filled 2017-01-22 (×2): qty 100

## 2017-01-22 MED ORDER — SODIUM CHLORIDE 0.9 % IV SOLN
INTRAVENOUS | Status: DC
  Administered 2017-01-22: 21:00:00 via INTRAVENOUS
  Filled 2017-01-22: qty 2.5

## 2017-01-22 MED ORDER — SODIUM CHLORIDE 0.9% FLUSH: 3.0000 mL | INTRAVENOUS | Status: DC | PRN

## 2017-01-22 MED ORDER — HEPARIN SODIUM (PORCINE) 1000 UNIT/ML IJ SOLN
INTRAMUSCULAR | Status: AC
  Filled 2017-01-22: qty 1

## 2017-01-22 MED ORDER — INSULIN REGULAR BOLUS VIA INFUSION
0.0000 [IU] | Freq: Three times a day (TID) | INTRAVENOUS | Status: DC
  Filled 2017-01-22: qty 10

## 2017-01-22 MED ORDER — DEXMEDETOMIDINE HCL IN NACL 200 MCG/50ML IV SOLN: 0.0000 ug/kg/h | INTRAVENOUS | Status: DC

## 2017-01-22 MED ORDER — ASPIRIN EC 325 MG PO TBEC
325.0000 mg | DELAYED_RELEASE_TABLET | Freq: Every day | ORAL | Status: DC
  Administered 2017-01-23 – 2017-01-28 (×6): 325 mg via ORAL
  Filled 2017-01-22 (×6): qty 1

## 2017-01-22 MED ORDER — NITROGLYCERIN IN D5W 200-5 MCG/ML-% IV SOLN: 0.0000 ug/min | INTRAVENOUS | Status: DC

## 2017-01-22 MED ORDER — ACETAMINOPHEN 160 MG/5ML PO SOLN: 1000.0000 mg | Freq: Four times a day (QID) | ORAL | Status: AC

## 2017-01-22 MED ORDER — 0.9 % SODIUM CHLORIDE (POUR BTL) OPTIME
TOPICAL | Status: DC | PRN
  Administered 2017-01-22: 6000 mL

## 2017-01-22 MED ORDER — DEXTROSE 5 % IV SOLN
1.5000 g | Freq: Two times a day (BID) | INTRAVENOUS | Status: AC
  Administered 2017-01-22 – 2017-01-24 (×4): 1.5 g via INTRAVENOUS
  Filled 2017-01-22 (×4): qty 1.5

## 2017-01-22 MED ORDER — SODIUM CHLORIDE 0.9 % IV SOLN
20.0000 ug | INTRAVENOUS | Status: AC
  Administered 2017-01-22: 20 ug via INTRAVENOUS
  Filled 2017-01-22: qty 5

## 2017-01-22 MED ORDER — HEMOSTATIC AGENTS (NO CHARGE) OPTIME
TOPICAL | Status: DC | PRN
  Administered 2017-01-22: 1 via TOPICAL

## 2017-01-22 MED ORDER — FENTANYL CITRATE (PF) 250 MCG/5ML IJ SOLN
INTRAMUSCULAR | Status: AC
  Filled 2017-01-22: qty 25

## 2017-01-22 MED ORDER — BISACODYL 10 MG RE SUPP: 10.0000 mg | Freq: Every day | RECTAL | Status: DC

## 2017-01-22 MED ORDER — SODIUM CHLORIDE 0.9% FLUSH
3.0000 mL | Freq: Two times a day (BID) | INTRAVENOUS | Status: DC
  Administered 2017-01-23 – 2017-01-24 (×2): 3 mL via INTRAVENOUS

## 2017-01-22 MED ORDER — LACTATED RINGERS IV SOLN: INTRAVENOUS | Status: DC

## 2017-01-22 MED ORDER — FENTANYL CITRATE (PF) 250 MCG/5ML IJ SOLN
INTRAMUSCULAR | Status: AC
  Filled 2017-01-22: qty 5

## 2017-01-22 MED ORDER — PROPOFOL 10 MG/ML IV BOLUS
INTRAVENOUS | Status: AC
  Filled 2017-01-22: qty 20

## 2017-01-22 MED ORDER — METOCLOPRAMIDE HCL 5 MG/ML IJ SOLN
10.0000 mg | Freq: Four times a day (QID) | INTRAMUSCULAR | Status: DC
  Administered 2017-01-22 – 2017-01-25 (×11): 10 mg via INTRAVENOUS
  Filled 2017-01-22 (×10): qty 2

## 2017-01-22 MED ORDER — MORPHINE SULFATE (PF) 2 MG/ML IV SOLN
1.0000 mg | INTRAVENOUS | Status: AC | PRN
  Administered 2017-01-22: 2 mg via INTRAVENOUS

## 2017-01-22 MED ORDER — MIDAZOLAM HCL 10 MG/2ML IJ SOLN
INTRAMUSCULAR | Status: AC
  Filled 2017-01-22: qty 2

## 2017-01-22 MED ORDER — METOPROLOL TARTRATE 25 MG/10 ML ORAL SUSPENSION: 12.5000 mg | Freq: Two times a day (BID) | ORAL | Status: DC

## 2017-01-22 MED ORDER — LACTATED RINGERS IV SOLN
INTRAVENOUS | Status: DC | PRN
  Administered 2017-01-22 (×4): via INTRAVENOUS

## 2017-01-22 MED ORDER — METOPROLOL TARTRATE 5 MG/5ML IV SOLN: 2.5000 mg | INTRAVENOUS | Status: DC | PRN

## 2017-01-22 MED ORDER — ACETAMINOPHEN 650 MG RE SUPP
650.0000 mg | Freq: Once | RECTAL | Status: AC
  Administered 2017-01-22: 650 mg via RECTAL

## 2017-01-22 MED ORDER — HEPARIN SODIUM (PORCINE) 1000 UNIT/ML IJ SOLN
INTRAMUSCULAR | Status: DC | PRN
  Administered 2017-01-22: 35000 [IU] via INTRAVENOUS

## 2017-01-22 MED ORDER — DOCUSATE SODIUM 100 MG PO CAPS
200.0000 mg | ORAL_CAPSULE | Freq: Every day | ORAL | Status: DC
  Administered 2017-01-23 – 2017-01-28 (×6): 200 mg via ORAL
  Filled 2017-01-22 (×6): qty 2

## 2017-01-22 MED ORDER — METOPROLOL TARTRATE 12.5 MG HALF TABLET
12.5000 mg | ORAL_TABLET | Freq: Two times a day (BID) | ORAL | Status: DC
  Administered 2017-01-23 – 2017-01-26 (×8): 12.5 mg via ORAL
  Filled 2017-01-22 (×10): qty 1

## 2017-01-22 MED ORDER — MAGNESIUM SULFATE 4 GM/100ML IV SOLN
4.0000 g | Freq: Once | INTRAVENOUS | Status: AC
  Administered 2017-01-22: 4 g via INTRAVENOUS
  Filled 2017-01-22: qty 100

## 2017-01-22 MED ORDER — FENTANYL CITRATE (PF) 250 MCG/5ML IJ SOLN
INTRAMUSCULAR | Status: DC | PRN
  Administered 2017-01-22: 150 ug via INTRAVENOUS
  Administered 2017-01-22: 100 ug via INTRAVENOUS
  Administered 2017-01-22 (×2): 250 ug via INTRAVENOUS
  Administered 2017-01-22: 150 ug via INTRAVENOUS
  Administered 2017-01-22: 250 ug via INTRAVENOUS
  Administered 2017-01-22 (×2): 100 ug via INTRAVENOUS
  Administered 2017-01-22: 150 ug via INTRAVENOUS

## 2017-01-22 MED ORDER — LACTATED RINGERS IV SOLN: 500.0000 mL | Freq: Once | INTRAVENOUS | Status: DC | PRN

## 2017-01-22 MED ORDER — CHLORHEXIDINE GLUCONATE 0.12 % MT SOLN
15.0000 mL | OROMUCOSAL | Status: AC
  Administered 2017-01-22: 15 mL via OROMUCOSAL

## 2017-01-22 MED ORDER — VANCOMYCIN HCL IN DEXTROSE 1-5 GM/200ML-% IV SOLN
1000.0000 mg | Freq: Once | INTRAVENOUS | Status: DC
  Filled 2017-01-22: qty 200

## 2017-01-22 MED ORDER — LIDOCAINE 2% (20 MG/ML) 5 ML SYRINGE
INTRAMUSCULAR | Status: DC | PRN
Start: 2017-01-22 — End: 2017-01-22
  Administered 2017-01-22: 100 mg via INTRAVENOUS

## 2017-01-22 MED ORDER — PROPOFOL 10 MG/ML IV BOLUS
INTRAVENOUS | Status: DC | PRN
  Administered 2017-01-22: 50 mg via INTRAVENOUS
  Administered 2017-01-22: 110 mg via INTRAVENOUS

## 2017-01-22 MED ORDER — ACETAMINOPHEN 500 MG PO TABS
1000.0000 mg | ORAL_TABLET | Freq: Four times a day (QID) | ORAL | Status: AC
  Administered 2017-01-22 – 2017-01-27 (×18): 1000 mg via ORAL
  Filled 2017-01-22 (×17): qty 2

## 2017-01-22 MED ORDER — ROCURONIUM BROMIDE 50 MG/5ML IV SOSY
PREFILLED_SYRINGE | INTRAVENOUS | Status: AC
  Filled 2017-01-22: qty 10

## 2017-01-22 MED ORDER — VANCOMYCIN HCL IN DEXTROSE 1-5 GM/200ML-% IV SOLN
1000.0000 mg | Freq: Two times a day (BID) | INTRAVENOUS | Status: AC
  Administered 2017-01-22 – 2017-01-23 (×3): 1000 mg via INTRAVENOUS
  Filled 2017-01-22 (×3): qty 200

## 2017-01-22 MED ORDER — ASPIRIN 81 MG PO CHEW
324.0000 mg | CHEWABLE_TABLET | Freq: Every day | ORAL | Status: DC
  Filled 2017-01-22: qty 4

## 2017-01-22 MED ORDER — SODIUM CHLORIDE 0.9 % IJ SOLN
OROMUCOSAL | Status: DC | PRN
  Administered 2017-01-22 (×3): via TOPICAL

## 2017-01-22 MED ORDER — PROTAMINE SULFATE 10 MG/ML IV SOLN
INTRAVENOUS | Status: AC
  Filled 2017-01-22: qty 5

## 2017-01-22 MED ORDER — SODIUM CHLORIDE 0.9 % IV SOLN: 30.0000 meq | Freq: Once | INTRAVENOUS | Status: DC

## 2017-01-22 MED ORDER — ROCURONIUM BROMIDE 10 MG/ML (PF) SYRINGE
PREFILLED_SYRINGE | INTRAVENOUS | Status: DC | PRN
  Administered 2017-01-22 (×4): 50 mg via INTRAVENOUS

## 2017-01-22 MED ORDER — MIDAZOLAM HCL 5 MG/5ML IJ SOLN
INTRAMUSCULAR | Status: DC | PRN
  Administered 2017-01-22: 2 mg via INTRAVENOUS
  Administered 2017-01-22: 1 mg via INTRAVENOUS
  Administered 2017-01-22: 2 mg via INTRAVENOUS
  Administered 2017-01-22: 5 mg via INTRAVENOUS

## 2017-01-22 MED ORDER — BISACODYL 5 MG PO TBEC
10.0000 mg | DELAYED_RELEASE_TABLET | Freq: Every day | ORAL | Status: DC
  Administered 2017-01-23 – 2017-01-28 (×6): 10 mg via ORAL
  Filled 2017-01-22 (×6): qty 2

## 2017-01-22 MED ORDER — PLASMA-LYTE 148 IV SOLN
INTRAVENOUS | Status: DC
  Filled 2017-01-22: qty 2.5

## 2017-01-22 MED ORDER — SODIUM CHLORIDE 0.45 % IV SOLN
INTRAVENOUS | Status: DC | PRN
  Administered 2017-01-22: 15:00:00 via INTRAVENOUS

## 2017-01-22 MED ORDER — SODIUM CHLORIDE 0.9 % IV SOLN: INTRAVENOUS | Status: DC

## 2017-01-22 MED ORDER — ALBUMIN HUMAN 5 % IV SOLN
250.0000 mL | INTRAVENOUS | Status: AC | PRN
  Administered 2017-01-22 (×3): 250 mL via INTRAVENOUS
  Filled 2017-01-22 (×3): qty 250

## 2017-01-22 MED ORDER — MILRINONE LACTATE IN DEXTROSE 20-5 MG/100ML-% IV SOLN
INTRAVENOUS | Status: DC | PRN
  Administered 2017-01-22: 0.25 ug/kg/min via INTRAVENOUS

## 2017-01-22 MED ORDER — MORPHINE SULFATE (PF) 2 MG/ML IV SOLN
2.0000 mg | INTRAVENOUS | Status: AC | PRN
  Administered 2017-01-22 – 2017-01-24 (×10): 2 mg via INTRAVENOUS
  Filled 2017-01-22 (×11): qty 1

## 2017-01-22 MED ORDER — FAMOTIDINE IN NACL 20-0.9 MG/50ML-% IV SOLN
20.0000 mg | Freq: Two times a day (BID) | INTRAVENOUS | Status: AC
  Administered 2017-01-22: 20 mg via INTRAVENOUS

## 2017-01-22 MED ORDER — PANTOPRAZOLE SODIUM 40 MG PO TBEC
40.0000 mg | DELAYED_RELEASE_TABLET | Freq: Every day | ORAL | Status: DC
  Administered 2017-01-24 – 2017-01-28 (×5): 40 mg via ORAL
  Filled 2017-01-22 (×5): qty 1

## 2017-01-22 MED ORDER — MIDAZOLAM HCL 2 MG/2ML IJ SOLN: 2.0000 mg | INTRAMUSCULAR | Status: DC | PRN

## 2017-01-22 MED ORDER — OXYCODONE HCL 5 MG PO TABS
5.0000 mg | ORAL_TABLET | ORAL | Status: DC | PRN
  Administered 2017-01-23 – 2017-01-24 (×5): 10 mg via ORAL
  Filled 2017-01-22 (×5): qty 2

## 2017-01-22 MED ORDER — SODIUM CHLORIDE 0.9 % IV SOLN
0.0000 ug/min | INTRAVENOUS | Status: DC
  Administered 2017-01-22: 10 ug/min via INTRAVENOUS
  Filled 2017-01-22 (×2): qty 2

## 2017-01-22 MED FILL — Heparin Sodium (Porcine) Inj 1000 Unit/ML: INTRAMUSCULAR | Qty: 30 | Status: AC

## 2017-01-22 MED FILL — Magnesium Sulfate Inj 50%: INTRAMUSCULAR | Qty: 10 | Status: AC

## 2017-01-22 MED FILL — Potassium Chloride Inj 2 mEq/ML: INTRAVENOUS | Qty: 40 | Status: AC

## 2017-01-22 SURGICAL SUPPLY — 133 items
ADAPTER CARDIO PERF ANTE/RETRO (ADAPTER) IMPLANT
APPLIER CLIP 9.375 MED OPEN (MISCELLANEOUS)
APPLIER CLIP 9.375 SM OPEN (CLIP)
BAG DECANTER FOR FLEXI CONT (MISCELLANEOUS) ×4 IMPLANT
BANDAGE ACE 4X5 VEL STRL LF (GAUZE/BANDAGES/DRESSINGS) ×4 IMPLANT
BANDAGE ACE 6X5 VEL STRL LF (GAUZE/BANDAGES/DRESSINGS) ×4 IMPLANT
BANDAGE ELASTIC 4 VELCRO ST LF (GAUZE/BANDAGES/DRESSINGS) ×4 IMPLANT
BANDAGE ELASTIC 6 VELCRO ST LF (GAUZE/BANDAGES/DRESSINGS) ×4 IMPLANT
BLADE CLIPPER SURG (BLADE) ×4 IMPLANT
BLADE CORE FAN STRYKER (BLADE) IMPLANT
BLADE SAW SAG 29X58X.64 (BLADE) IMPLANT
BLADE STERNUM SYSTEM 6 (BLADE) ×4 IMPLANT
BLADE SURG 11 STRL SS (BLADE) ×4 IMPLANT
BLADE SURG 12 STRL SS (BLADE) ×4 IMPLANT
BNDG GAUZE ELAST 4 BULKY (GAUZE/BANDAGES/DRESSINGS) ×4 IMPLANT
CANISTER SUCT 3000ML PPV (MISCELLANEOUS) ×4 IMPLANT
CANNULA ARTERIAL NVNT 3/8 22FR (MISCELLANEOUS) ×4 IMPLANT
CANNULA GUNDRY RCSP 15FR (MISCELLANEOUS) IMPLANT
CATH RETROPLEGIA CORONARY 14FR (CATHETERS) IMPLANT
CATH ROBINSON RED A/P 18FR (CATHETERS) IMPLANT
CATH THORACIC 28FR (CATHETERS) ×4 IMPLANT
CATH THORACIC 28FR RT ANG (CATHETERS) ×4 IMPLANT
CATH THORACIC 36FR (CATHETERS) ×4 IMPLANT
CATH THORACIC 36FR RT ANG (CATHETERS) ×4 IMPLANT
CLIP APPLIE 9.375 MED OPEN (MISCELLANEOUS) IMPLANT
CLIP APPLIE 9.375 SM OPEN (CLIP) IMPLANT
CLIP FOGARTY SPRING 6M (CLIP) ×4 IMPLANT
CLIP TI MEDIUM 24 (CLIP) ×4 IMPLANT
CLIP TI WIDE RED SMALL 24 (CLIP) ×8 IMPLANT
CONN Y 3/8X3/8X3/8  BEN (MISCELLANEOUS)
CONN Y 3/8X3/8X3/8 BEN (MISCELLANEOUS) IMPLANT
CRADLE DONUT ADULT HEAD (MISCELLANEOUS) ×4 IMPLANT
DRAIN CHANNEL 10M FLAT 3/4 FLT (DRAIN) ×4 IMPLANT
DRAPE CARDIOVASCULAR INCISE (DRAPES) ×2
DRAPE SLUSH/WARMER DISC (DRAPES) IMPLANT
DRAPE SRG 135X102X78XABS (DRAPES) ×2 IMPLANT
DRSG AQUACEL AG ADV 3.5X14 (GAUZE/BANDAGES/DRESSINGS) ×4 IMPLANT
ELECT BLADE 6.5 EXT (BLADE) ×4 IMPLANT
ELECT CAUTERY BLADE 6.4 (BLADE) IMPLANT
ELECT REM PT RETURN 9FT ADLT (ELECTROSURGICAL) ×8
ELECTRODE REM PT RTRN 9FT ADLT (ELECTROSURGICAL) ×4 IMPLANT
FELT TEFLON 1X6 (MISCELLANEOUS) ×4 IMPLANT
GAUZE SPONGE 4X4 12PLY STRL (GAUZE/BANDAGES/DRESSINGS) ×8 IMPLANT
GAUZE XEROFORM 1X8 LF (GAUZE/BANDAGES/DRESSINGS) ×4 IMPLANT
GLOVE BIO SURGEON STRL SZ 6 (GLOVE) ×4 IMPLANT
GLOVE BIO SURGEON STRL SZ 6.5 (GLOVE) ×3 IMPLANT
GLOVE BIO SURGEON STRL SZ7 (GLOVE) ×4 IMPLANT
GLOVE BIO SURGEON STRL SZ7.5 (GLOVE) ×8 IMPLANT
GLOVE BIO SURGEONS STRL SZ 6.5 (GLOVE) ×1
GLOVE BIOGEL PI IND STRL 6 (GLOVE) ×2 IMPLANT
GLOVE BIOGEL PI IND STRL 6.5 (GLOVE) ×2 IMPLANT
GLOVE BIOGEL PI IND STRL 7.0 (GLOVE) ×2 IMPLANT
GLOVE BIOGEL PI INDICATOR 6 (GLOVE) ×2
GLOVE BIOGEL PI INDICATOR 6.5 (GLOVE) ×2
GLOVE BIOGEL PI INDICATOR 7.0 (GLOVE) ×2
GLOVE EUDERMIC 7 POWDERFREE (GLOVE) ×4 IMPLANT
GOWN STRL REUS W/ TWL LRG LVL3 (GOWN DISPOSABLE) ×8 IMPLANT
GOWN STRL REUS W/TWL LRG LVL3 (GOWN DISPOSABLE) ×8
HEMOSTAT POWDER SURGIFOAM 1G (HEMOSTASIS) IMPLANT
HEMOSTAT SURGICEL 2X14 (HEMOSTASIS) IMPLANT
INSERT FOGARTY 61MM (MISCELLANEOUS) ×4 IMPLANT
INSERT FOGARTY XLG (MISCELLANEOUS) IMPLANT
KIT BASIN OR (CUSTOM PROCEDURE TRAY) ×4 IMPLANT
KIT ROOM TURNOVER OR (KITS) ×4 IMPLANT
KIT SUCTION CATH 14FR (SUCTIONS) IMPLANT
KIT VASOVIEW HEMOPRO VH 3000 (KITS) ×4 IMPLANT
MARKER GRAFT CORONARY BYPASS (MISCELLANEOUS) IMPLANT
NS IRRIG 1000ML POUR BTL (IV SOLUTION) ×16 IMPLANT
PACK OPEN HEART (CUSTOM PROCEDURE TRAY) ×4 IMPLANT
PAD ARMBOARD 7.5X6 YLW CONV (MISCELLANEOUS) ×8 IMPLANT
PAD DEFIB R2 (MISCELLANEOUS) IMPLANT
PAD ELECT DEFIB RADIOL ZOLL (MISCELLANEOUS) ×4 IMPLANT
PENCIL BUTTON HOLSTER BLD 10FT (ELECTRODE) IMPLANT
PUNCH AORTIC ROTATE 4.0MM (MISCELLANEOUS) ×4 IMPLANT
PUNCH AORTIC ROTATE 4.5MM 8IN (MISCELLANEOUS) IMPLANT
PUNCH AORTIC ROTATE 5MM 8IN (MISCELLANEOUS) IMPLANT
SENSOR MYOCARDIAL TEMP (MISCELLANEOUS) ×4 IMPLANT
SET CARDIOPLEGIA MPS 5001102 (MISCELLANEOUS) ×4 IMPLANT
SOLUTION ANTI FOG 6CC (MISCELLANEOUS) ×8 IMPLANT
SPONGE GAUZE 4X4 12PLY STER LF (GAUZE/BANDAGES/DRESSINGS) ×8 IMPLANT
SPONGE INTESTINAL PEANUT (DISPOSABLE) ×4 IMPLANT
SPONGE LAP 18X18 X RAY DECT (DISPOSABLE) ×4 IMPLANT
SPONGE LAP 4X18 X RAY DECT (DISPOSABLE) ×4 IMPLANT
SUT BONE WAX W31G (SUTURE) ×4 IMPLANT
SUT ETHIBOND 2 0 SH (SUTURE) ×2
SUT ETHIBOND 2 0 SH 36X2 (SUTURE) ×2 IMPLANT
SUT ETHILON 3 0 FSL (SUTURE) ×12 IMPLANT
SUT MNCRL AB 4-0 PS2 18 (SUTURE) ×8 IMPLANT
SUT PROLENE 3 0 SH 1 (SUTURE) IMPLANT
SUT PROLENE 3 0 SH DA (SUTURE) ×4 IMPLANT
SUT PROLENE 3 0 SH1 36 (SUTURE) ×4 IMPLANT
SUT PROLENE 4 0 RB 1 (SUTURE)
SUT PROLENE 4 0 SH DA (SUTURE) ×4 IMPLANT
SUT PROLENE 4-0 RB1 .5 CRCL 36 (SUTURE) IMPLANT
SUT PROLENE 5 0 C 1 36 (SUTURE) ×4 IMPLANT
SUT PROLENE 6 0 C 1 30 (SUTURE) ×24 IMPLANT
SUT PROLENE 6 0 CC (SUTURE) ×12 IMPLANT
SUT PROLENE 7 0 BV 1 (SUTURE) ×4 IMPLANT
SUT PROLENE 7 0 BV1 MDA (SUTURE) ×4 IMPLANT
SUT PROLENE 7 0 DA (SUTURE) IMPLANT
SUT PROLENE 8 0 BV175 6 (SUTURE) IMPLANT
SUT PROLENE BLUE 7 0 (SUTURE) ×4 IMPLANT
SUT PROLENE POLY MONO (SUTURE) ×4 IMPLANT
SUT SILK  1 MH (SUTURE) ×2
SUT SILK 1 MH (SUTURE) ×2 IMPLANT
SUT SILK 2 0 SH CR/8 (SUTURE) ×8 IMPLANT
SUT SILK 3 0 SH CR/8 (SUTURE) ×4 IMPLANT
SUT STEEL 6MS V (SUTURE) ×4 IMPLANT
SUT STEEL STERNAL CCS#1 18IN (SUTURE) IMPLANT
SUT STEEL SZ 6 DBL 3X14 BALL (SUTURE) ×8 IMPLANT
SUT VIC AB 1 CTX 36 (SUTURE)
SUT VIC AB 1 CTX36XBRD ANBCTR (SUTURE) IMPLANT
SUT VIC AB 2-0 CT1 27 (SUTURE) ×2
SUT VIC AB 2-0 CT1 TAPERPNT 27 (SUTURE) ×2 IMPLANT
SUT VIC AB 2-0 CTX 27 (SUTURE) IMPLANT
SUT VIC AB 3-0 SH 27 (SUTURE) ×2
SUT VIC AB 3-0 SH 27X BRD (SUTURE) ×2 IMPLANT
SUT VIC AB 3-0 X1 27 (SUTURE) IMPLANT
SUTURE E-PAK OPEN HEART (SUTURE) ×4 IMPLANT
SYSTEM SAHARA CHEST DRAIN ATS (WOUND CARE) ×4 IMPLANT
TAPE CLOTH SURG 4X10 WHT LF (GAUZE/BANDAGES/DRESSINGS) ×4 IMPLANT
TAPE PAPER 3X10 WHT MICROPORE (GAUZE/BANDAGES/DRESSINGS) ×4 IMPLANT
TOWEL GREEN STERILE (TOWEL DISPOSABLE) ×16 IMPLANT
TOWEL GREEN STERILE FF (TOWEL DISPOSABLE) ×8 IMPLANT
TOWEL OR 17X24 6PK STRL BLUE (TOWEL DISPOSABLE) ×4 IMPLANT
TOWEL OR 17X26 10 PK STRL BLUE (TOWEL DISPOSABLE) ×4 IMPLANT
TRAY FOLEY IC TEMP SENS 16FR (CATHETERS) ×4 IMPLANT
TUBE CONNECTING 12'X1/4 (SUCTIONS) ×1
TUBE CONNECTING 12X1/4 (SUCTIONS) ×3 IMPLANT
TUBING INSUFFLATION (TUBING) ×4 IMPLANT
UNDERPAD 30X30 (UNDERPADS AND DIAPERS) ×4 IMPLANT
WATER STERILE IRR 1000ML POUR (IV SOLUTION) ×8 IMPLANT
YANKAUER SUCT BULB TIP NO VENT (SUCTIONS) ×4 IMPLANT

## 2017-01-22 NOTE — Progress Notes (Signed)
The patient was examined and preop studies reviewed. There has been no change from the prior exam and the patient is ready for surgery. Plan CABG on M [facility]

## 2017-01-22 NOTE — Anesthesia Procedure Notes (Addendum)
Central Venous Catheter Insertion Performed by: Rica Koyanagi, anesthesiologist Start/End3/11/2016 7:00 AM, 01/22/2017 7:19 AM Preanesthetic checklist: patient identified, IV checked, site marked, risks and benefits discussed, surgical consent, monitors and equipment checked, pre-op evaluation and timeout performed Position: Trendelenburg Lidocaine 1% used for infiltration Hand hygiene performed , maximum sterile barriers used  and Seldinger technique used Catheter size: 7.5 Fr PA cath was placed.Procedure performed using ultrasound guided technique. Attempts: 1 Following insertion, line sutured and dressing applied. Post procedure assessment: blood return through all ports, free fluid flow and no air  Patient tolerated the procedure well with no immediate complications.

## 2017-01-22 NOTE — Brief Op Note (Signed)
01/20/2017 - 01/22/2017  12:39 PM  PATIENT:  [name]  61 y.o. male  PRE-OPERATIVE DIAGNOSIS:  CAD  POST-OPERATIVE DIAGNOSIS:  CAD  PROCEDURE:  Procedure(s):  CORONARY ARTERY BYPASS GRAFTING x 4 -LIMA to LAD -SVG to DIAGONAL -SVG to RAMUS -SVG to PDA  ENDOSCOPIC/OPEN HARVEST OF GREATER SAPHENOUS VEIN -Right Leg  TRANSESOPHAGEAL ECHOCARDIOGRAM (TEE) (N/A)  SURGEON:  Surgeon(s) and Role:    * Ivin Poot, MD - Primary  PHYSICIAN ASSISTANT: Ellwood Handler PA-C  ANESTHESIA:   general  EBL:  Total I/O In: -  Out: 200 [Urine:200]  BLOOD ADMINISTERED: CELLSAVER  DRAINS: Left Pleural Chest Tube, Mediastinal Chest drains   LOCAL MEDICATIONS USED:  NONE  SPECIMEN:  No Specimen  DISPOSITION OF SPECIMEN:  N/A  COUNTS:  YES  TOURNIQUET:  * No tourniquets in log *  DICTATION: .Dragon Dictation  PLAN OF CARE: Admit to inpatient   PATIENT DISPOSITION:  ICU - intubated and hemodynamically stable.   Delay start of Pharmacological VTE agent (>24hrs) due to surgical blood loss or risk of bleeding: yes

## 2017-01-22 NOTE — Anesthesia Preprocedure Evaluation (Signed)
Anesthesia Evaluation  Patient identified by MRN, date of birth, ID band Patient awake    Airway Mallampati: II  TM Distance: >3 FB Neck ROM: Full    Dental  (+) Teeth Intact   Pulmonary neg pulmonary ROS,    breath sounds clear to auscultation       Cardiovascular + Past MI   Rhythm:Regular Rate:Normal     Neuro/Psych negative neurological ROS     GI/Hepatic PUD, GERD  ,  Endo/Other  negative endocrine ROS  Renal/GU negative Renal ROS     Musculoskeletal   Abdominal (+) + obese,   Peds  Hematology negative hematology ROS (+)   Anesthesia Other Findings   Reproductive/Obstetrics                             Anesthesia Physical Anesthesia Plan  ASA: III  Anesthesia Plan: General   Post-op Pain Management:    Induction: Intravenous  Airway Management Planned: Oral ETT  Additional Equipment: Arterial line, TEE, PA Cath and Ultrasound Guidance Line Placement  Intra-op Plan:   Post-operative Plan: Post-operative intubation/ventilation  Informed Consent: I have reviewed the patients History and Physical, chart, labs and discussed the procedure including the risks, benefits and alternatives for the proposed anesthesia with the patient or authorized representative who has indicated his/her understanding and acceptance.   Dental advisory given  Plan Discussed with: CRNA  Anesthesia Plan Comments:         Anesthesia Quick Evaluation

## 2017-01-22 NOTE — Procedures (Signed)
Extubation Procedure Note  Patient Details:   Name: [name] DOB: [birth year] MRN: [id number]   Airway Documentation:     Evaluation  O2 sats: stable throughout Complications: No apparent complications Patient did tolerate procedure well. Bilateral Breath Sounds: Coarse crackles, Diminished   Yes  NIF-32 FVC-863ml Incentive spirometer instructed 767ml  Revonda Standard 01/22/2017, 6:33 PM

## 2017-01-22 NOTE — Anesthesia Postprocedure Evaluation (Addendum)
Anesthesia Post Note  Patient: [name]  Procedure(s) Performed: Procedure(s) (LRB): CORONARY ARTERY BYPASS GRAFTING (CABG) times 4 using left internal mammary artery and right greater saphenous leg vein. (N/A) TRANSESOPHAGEAL ECHOCARDIOGRAM (TEE) (N/A)  Patient location during evaluation: SICU Anesthesia Type: General Level of consciousness: sedated and patient remains intubated per anesthesia plan Pain management: pain level controlled Vital Signs Assessment: post-procedure vital signs reviewed and stable Respiratory status: patient remains intubated per anesthesia plan Cardiovascular status: stable Anesthetic complications: no       Last Vitals:  Vitals:   01/21/17 2052 01/22/17 0446  BP: 110/64 (!) 156/93  Pulse: 78 84  Resp: 16 18  Temp: 36.7 C 36.6 C    Last Pain:  Vitals:   01/22/17 0446  TempSrc: Oral  PainSc:                  Tyishia Aune,JAMES TERRILL

## 2017-01-22 NOTE — Anesthesia Procedure Notes (Signed)
Procedure Name: Intubation Date/Time: 01/22/2017 7:59 AM Performed by: Valda Favia Pre-anesthesia Checklist: Patient identified, Emergency Drugs available, Suction available, Patient being monitored and Timeout performed Patient Re-evaluated:Patient Re-evaluated prior to inductionOxygen Delivery Method: Circle system utilized Preoxygenation: Pre-oxygenation with 100% oxygen Intubation Type: IV induction Ventilation: Mask ventilation without difficulty Laryngoscope Size: Mac and 4 Grade View: Grade I Tube type: Oral Tube size: 8.0 mm Number of attempts: 1 Airway Equipment and Method: Stylet Placement Confirmation: ETT inserted through vocal cords under direct vision,  positive ETCO2 and breath sounds checked- equal and bilateral Secured at: 22 cm Tube secured with: Tape Dental Injury: Teeth and Oropharynx as per pre-operative assessment

## 2017-01-22 NOTE — Transfer of Care (Signed)
Immediate Anesthesia Transfer of Care Note  Patient: [name]  Procedure(s) Performed: Procedure(s): CORONARY ARTERY BYPASS GRAFTING (CABG) times 4 using left internal mammary artery and right greater saphenous leg vein. (N/A) TRANSESOPHAGEAL ECHOCARDIOGRAM (TEE) (N/A)  Patient Location: ICU  Anesthesia Type:General  Level of Consciousness: sedated  Airway & Oxygen Therapy: Patient remains intubated per anesthesia plan and Patient placed on Ventilator (see vital sign flow sheet for setting)  Post-op Assessment: Report given to RN and Post -op Vital signs reviewed and stable  Post vital signs: Reviewed and stable  Last Vitals:  Vitals:   01/21/17 2052 01/22/17 0446  BP: 110/64 (!) 156/93  Pulse: 78 84  Resp: 16 18  Temp: 36.7 C 36.6 C    Last Pain:  Vitals:   01/22/17 0446  TempSrc: Oral  PainSc:       Patients Stated Pain Goal: 0 ([id number])  Complications: No apparent anesthesia complications

## 2017-01-22 NOTE — Progress Notes (Signed)
Patient ID: [name], male   DOB: [birth year], 61 y.o.   MRN: [id number]   SICU Evening Rounds:   Hemodynamically stable  CI = 1.7 on milrinone 0.25. Giving some albumin since PA pressures low.  Remains on vent  Urine output good  CT output low  CBC    Component Value Date/Time   WBC 23.3 (H) 01/22/2017 1455   RBC 3.84 (L) 01/22/2017 1455   HGB 11.3 (L) 01/22/2017 1455   HCT 33.6 (L) 01/22/2017 1455   PLT 175 01/22/2017 1455   MCV 87.5 01/22/2017 1455   MCH 29.4 01/22/2017 1455   MCHC 33.6 01/22/2017 1455   RDW 12.8 01/22/2017 1455   LYMPHSABS 2.1 09/18/2014 1135   MONOABS 0.9 09/18/2014 1135   EOSABS 1.3 (H) 09/18/2014 1135   BASOSABS 0.1 09/18/2014 1135     BMET    Component Value Date/Time   NA 139 01/22/2017 1454   K 4.2 01/22/2017 1454   CL 101 01/22/2017 1321   CO2 28 01/22/2017 0208   GLUCOSE 133 (H) 01/22/2017 1454   BUN 18 01/22/2017 1321   CREATININE 0.80 01/22/2017 1321   CALCIUM 9.0 01/22/2017 0208   GFRNONAA >60 01/22/2017 0208   GFRAA >60 01/22/2017 0208     A/P:  Stable postop course. Continue current plans. Wean vent as tolerated.

## 2017-01-23 ENCOUNTER — Encounter (HOSPITAL_COMMUNITY): Payer: Self-pay | Admitting: Cardiothoracic Surgery

## 2017-01-23 ENCOUNTER — Inpatient Hospital Stay (HOSPITAL_COMMUNITY): Payer: BLUE CROSS/BLUE SHIELD

## 2017-01-23 LAB — POCT I-STAT, CHEM 8
BUN: 20 mg/dL (ref 6–20)
CHLORIDE: 99 mmol/L — AB (ref 101–111)
Calcium, Ion: 1.19 mmol/L (ref 1.15–1.40)
Creatinine, Ser: 1 mg/dL (ref 0.61–1.24)
GLUCOSE: 150 mg/dL — AB (ref 65–99)
HEMATOCRIT: 31 % — AB (ref 39.0–52.0)
HEMOGLOBIN: 10.5 g/dL — AB (ref 13.0–17.0)
POTASSIUM: 4.5 mmol/L (ref 3.5–5.1)
Sodium: 136 mmol/L (ref 135–145)
TCO2: 25 mmol/L (ref 0–100)

## 2017-01-23 LAB — CBC
HCT: 30.6 % — ABNORMAL LOW (ref 39.0–52.0)
HCT: 31.8 % — ABNORMAL LOW (ref 39.0–52.0)
Hemoglobin: 10.2 g/dL — ABNORMAL LOW (ref 13.0–17.0)
Hemoglobin: 10.4 g/dL — ABNORMAL LOW (ref 13.0–17.0)
MCH: 29.3 pg (ref 26.0–34.0)
MCH: 29.4 pg (ref 26.0–34.0)
MCHC: 33.3 g/dL (ref 30.0–36.0)
MCHC: 32.7 g/dL (ref 30.0–36.0)
MCV: 87.9 fL (ref 78.0–100.0)
MCV: 89.8 fL (ref 78.0–100.0)
PLATELETS: 142 10*3/uL — AB (ref 150–400)
PLATELETS: 149 10*3/uL — AB (ref 150–400)
RBC: 3.48 MIL/uL — AB (ref 4.22–5.81)
RBC: 3.54 MIL/uL — AB (ref 4.22–5.81)
RDW: 13.2 % (ref 11.5–15.5)
RDW: 13.3 % (ref 11.5–15.5)
WBC: 14.9 10*3/uL — ABNORMAL HIGH (ref 4.0–10.5)
WBC: 22.1 10*3/uL — ABNORMAL HIGH (ref 4.0–10.5)

## 2017-01-23 LAB — PREPARE FRESH FROZEN PLASMA
Unit division: 0
Unit division: 0

## 2017-01-23 LAB — GLUCOSE, CAPILLARY
GLUCOSE-CAPILLARY: 120 mg/dL — AB (ref 65–99)
GLUCOSE-CAPILLARY: 123 mg/dL — AB (ref 65–99)
GLUCOSE-CAPILLARY: 127 mg/dL — AB (ref 65–99)
GLUCOSE-CAPILLARY: 127 mg/dL — AB (ref 65–99)
GLUCOSE-CAPILLARY: 135 mg/dL — AB (ref 65–99)
GLUCOSE-CAPILLARY: 137 mg/dL — AB (ref 65–99)
GLUCOSE-CAPILLARY: 144 mg/dL — AB (ref 65–99)
Glucose-Capillary: 131 mg/dL — ABNORMAL HIGH (ref 65–99)
Glucose-Capillary: 126 mg/dL — ABNORMAL HIGH (ref 65–99)
Glucose-Capillary: 126 mg/dL — ABNORMAL HIGH (ref 65–99)
Glucose-Capillary: 132 mg/dL — ABNORMAL HIGH (ref 65–99)
Glucose-Capillary: 136 mg/dL — ABNORMAL HIGH (ref 65–99)
Glucose-Capillary: 164 mg/dL — ABNORMAL HIGH (ref 65–99)
Glucose-Capillary: 128 mg/dL — ABNORMAL HIGH (ref 65–99)
Glucose-Capillary: 144 mg/dL — ABNORMAL HIGH (ref 65–99)
Glucose-Capillary: 169 mg/dL — ABNORMAL HIGH (ref 65–99)

## 2017-01-23 LAB — MAGNESIUM
Magnesium: 2.5 mg/dL — ABNORMAL HIGH (ref 1.7–2.4)
Magnesium: 2.5 mg/dL — ABNORMAL HIGH (ref 1.7–2.4)

## 2017-01-23 LAB — BPAM FFP
BLOOD PRODUCT EXPIRATION DATE: 201803022359
Blood Product Expiration Date: 201803022359
ISSUE DATE / TIME: 201803011254
ISSUE DATE / TIME: 201803011254
UNIT TYPE AND RH: 6200
Unit Type and Rh: 6200

## 2017-01-23 LAB — BASIC METABOLIC PANEL
Anion gap: 7 (ref 5–15)
BUN: 14 mg/dL (ref 6–20)
CHLORIDE: 107 mmol/L (ref 101–111)
CO2: 22 mmol/L (ref 22–32)
Calcium: 7.8 mg/dL — ABNORMAL LOW (ref 8.9–10.3)
Creatinine, Ser: 0.91 mg/dL (ref 0.61–1.24)
GFR calc Af Amer: >60 mL/min (ref >60)
GLUCOSE: 128 mg/dL — AB (ref 65–99)
POTASSIUM: 4.6 mmol/L (ref 3.5–5.1)
Sodium: 136 mmol/L (ref 135–145)

## 2017-01-23 LAB — HEMOGLOBIN A1C
HEMOGLOBIN A1C: 6 % — AB (ref 4.8–5.6)
MEAN PLASMA GLUCOSE: 126 mg/dL

## 2017-01-23 LAB — CREATININE, SERUM
Creatinine, Ser: 1.1 mg/dL (ref 0.61–1.24)
GFR calc Af Amer: >60 mL/min (ref >60)
GFR calc non Af Amer: >60 mL/min (ref >60)

## 2017-01-23 MED ORDER — INSULIN DETEMIR 100 UNIT/ML ~~LOC~~ SOLN
10.0000 [IU] | Freq: Two times a day (BID) | SUBCUTANEOUS | Status: DC
  Administered 2017-01-23 – 2017-01-24 (×4): 10 [IU] via SUBCUTANEOUS
  Filled 2017-01-23 (×8): qty 0.1

## 2017-01-23 MED ORDER — INSULIN ASPART 100 UNIT/ML ~~LOC~~ SOLN
0.0000 [IU] | SUBCUTANEOUS | Status: DC
  Administered 2017-01-23: 2 [IU] via SUBCUTANEOUS
  Administered 2017-01-23: 4 [IU] via SUBCUTANEOUS
  Administered 2017-01-24 (×3): 2 [IU] via SUBCUTANEOUS

## 2017-01-23 MED ORDER — FUROSEMIDE 10 MG/ML IJ SOLN
20.0000 mg | Freq: Two times a day (BID) | INTRAMUSCULAR | Status: DC
  Administered 2017-01-23 – 2017-01-24 (×3): 20 mg via INTRAVENOUS
  Filled 2017-01-23 (×3): qty 2

## 2017-01-23 MED FILL — Heparin Sodium (Porcine) Inj 1000 Unit/ML: INTRAMUSCULAR | Qty: 20 | Status: AC

## 2017-01-23 MED FILL — Sodium Bicarbonate IV Soln 8.4%: INTRAVENOUS | Qty: 50 | Status: AC

## 2017-01-23 MED FILL — Sodium Chloride IV Soln 0.9%: INTRAVENOUS | Qty: 2000 | Status: AC

## 2017-01-23 MED FILL — Albumin, Human Inj 5%: INTRAVENOUS | Qty: 250 | Status: AC

## 2017-01-23 MED FILL — Lidocaine HCl IV Inj 20 MG/ML: INTRAVENOUS | Qty: 5 | Status: AC

## 2017-01-23 MED FILL — Electrolyte-R (PH 7.4) Solution: INTRAVENOUS | Qty: 1000 | Status: AC

## 2017-01-23 MED FILL — Mannitol IV Soln 20%: INTRAVENOUS | Qty: 500 | Status: AC

## 2017-01-23 NOTE — Care Management Note (Signed)
Case Management Note  Patient Details  Name: [name] MRN: [id number] Date of Birth: 04/15/1956  Subjective/Objective:    POD 1 CABG x 4, dc tubes, diuresis, mobilize.  NCM will cont to follow for dc needs.                  Action/Plan:   Expected Discharge Date:   (unknown)               Expected Discharge Plan:     In-House Referral:     Discharge planning Services  CM Consult  Post Acute Care Choice:    Choice offered to:     DME Arranged:    DME Agency:     HH Arranged:    HH Agency:     Status of Service:  In process, will continue to follow  If discussed at Long Length of Stay Meetings, dates discussed:    Additional Comments:  Zenon Mayo, RN 01/23/2017, 4:42 PM

## 2017-01-23 NOTE — Progress Notes (Signed)
CT surgery p.m. Rounds  Patient examined and record reviewed.Hemodynamics stable,labs satisfactory.Patient had stable day.Continue current care. [name] 01/23/2017

## 2017-01-23 NOTE — Progress Notes (Signed)
1 Day Post-Op Procedure(s) (LRB): CORONARY ARTERY BYPASS GRAFTING (CABG) times 4 using left internal mammary artery and right greater saphenous leg vein. (N/A) TRANSESOPHAGEAL ECHOCARDIOGRAM (TEE) (N/A) Subjective: CABG for preop NSTEMI Stable this am  New R 15 % PNTX nsr Objective: Vital signs in last 24 hours: Temp:  [96.4 F (35.8 C)-100.6 F (38.1 C)] 100 F (37.8 C) (03/02 0600) Pulse Rate:  [78-101] 92 (03/02 0600) Cardiac Rhythm: Normal sinus rhythm (03/02 0400) Resp:  [11-25] 14 (03/02 0600) BP: (83-119)/(52-80) 102/67 (03/02 0600) SpO2:  [91 %-100 %] 92 % (03/02 0600) Arterial Line BP: (88-156)/(40-79) 100/53 (03/02 0600) FiO2 (%):  [40 %-50 %] 40 % (03/01 1804) Weight:  [251 lb 15.8 oz (114.3 kg)] 251 lb 15.8 oz (114.3 kg) (03/02 0500)  Hemodynamic parameters for last 24 hours: PAP: (22-38)/(9-19) 28/15 CO:  [4 L/min-7.2 L/min] 7 L/min CI:  [1.8 L/min/m2-3.2 L/min/m2] 3.1 L/min/m2  Intake/Output from previous day: 03/01 0701 - 03/02 0700 In: 6434.3 [P.O.:200; I.V.:4012.3; Blood:1072; IV Piggyback:1150] Out: 2165 [Urine:1555; Drains:40; Chest Tube:570] Intake/Output this shift: No intake/output data recorded.       Exam    General- alert and comfortable   Lungs- clear without rales, wheezes   Cor- regular rate and rhythm, no murmur , gallop   Abdomen- soft, non-tender   Extremities - warm, non-tender, minimal edema   Neuro- oriented, appropriate, no focal weakness   Lab Results:  Recent Labs  01/22/17 2107 01/22/17 2113 01/23/17 0400  WBC 16.3*  --  14.9*  HGB 10.3* 9.9* 10.2*  HCT 31.2* 29.0* 30.6*  PLT 148*  --  142*   BMET:  Recent Labs  01/22/17 0208  01/22/17 2113 01/23/17 0400  NA 139  < > 140 136  K 4.6  < > 4.2 4.6  CL 105  < > 107 107  CO2 28  --   --  22  GLUCOSE 129*  < > 118* 128*  BUN 18  < > 16 14  CREATININE 1.04  < > 0.80 0.91  CALCIUM 9.0  --   --  7.8*  < > = values in this interval not displayed.  PT/INR:  Recent  Labs  01/22/17 1455  LABPROT 16.5*  INR 1.33   ABG    Component Value Date/Time   PHART 7.357 01/22/2017 1940   HCO3 18.7 (L) 01/22/2017 1940   TCO2 21 01/22/2017 2113   ACIDBASEDEF 6.0 (H) 01/22/2017 1940   O2SAT 95.0 01/22/2017 1940   CBG (last 3)   Recent Labs  01/23/17 0505 01/23/17 0602 01/23/17 0653  GLUCAP 132* 137* 144*    Assessment/Plan: S/P Procedure(s) (LRB): CORONARY ARTERY BYPASS GRAFTING (CABG) times 4 using left internal mammary artery and right greater saphenous leg vein. (N/A) TRANSESOPHAGEAL ECHOCARDIOGRAM (TEE) (N/A) Mobilize Diuresis Diabetes control d/c tubes/lines See progression orders   LOS: 3 days    [name] 01/23/2017

## 2017-01-24 ENCOUNTER — Inpatient Hospital Stay (HOSPITAL_COMMUNITY): Payer: BLUE CROSS/BLUE SHIELD

## 2017-01-24 LAB — GLUCOSE, CAPILLARY
GLUCOSE-CAPILLARY: 123 mg/dL — AB (ref 65–99)
GLUCOSE-CAPILLARY: 128 mg/dL — AB (ref 65–99)
GLUCOSE-CAPILLARY: 130 mg/dL — AB (ref 65–99)
GLUCOSE-CAPILLARY: 142 mg/dL — AB (ref 65–99)
GLUCOSE-CAPILLARY: 128 mg/dL — AB (ref 65–99)
GLUCOSE-CAPILLARY: 121 mg/dL — AB (ref 65–99)

## 2017-01-24 LAB — BASIC METABOLIC PANEL
Anion gap: 6 (ref 5–15)
BUN: 17 mg/dL (ref 6–20)
CO2: 26 mmol/L (ref 22–32)
Calcium: 8.1 mg/dL — ABNORMAL LOW (ref 8.9–10.3)
Chloride: 101 mmol/L (ref 101–111)
Creatinine, Ser: 1 mg/dL (ref 0.61–1.24)
GFR calc Af Amer: >60 mL/min (ref >60)
GFR calc non Af Amer: >60 mL/min (ref >60)
Glucose, Bld: 127 mg/dL — ABNORMAL HIGH (ref 65–99)
Potassium: 4.2 mmol/L (ref 3.5–5.1)
Sodium: 133 mmol/L — ABNORMAL LOW (ref 135–145)

## 2017-01-24 LAB — CBC
HCT: 29 % — ABNORMAL LOW (ref 39.0–52.0)
Hemoglobin: 9.6 g/dL — ABNORMAL LOW (ref 13.0–17.0)
MCH: 30 pg (ref 26.0–34.0)
MCHC: 33.1 g/dL (ref 30.0–36.0)
MCV: 90.6 fL (ref 78.0–100.0)
Platelets: 132 10*3/uL — ABNORMAL LOW (ref 150–400)
RBC: 3.2 MIL/uL — ABNORMAL LOW (ref 4.22–5.81)
RDW: 13.7 % (ref 11.5–15.5)
WBC: 19.6 10*3/uL — ABNORMAL HIGH (ref 4.0–10.5)

## 2017-01-24 MED ORDER — MAGNESIUM HYDROXIDE 400 MG/5ML PO SUSP: 30.0000 mL | Freq: Every day | ORAL | Status: DC | PRN

## 2017-01-24 MED ORDER — ZOLPIDEM TARTRATE 5 MG PO TABS: 5.0000 mg | ORAL_TABLET | Freq: Every evening | ORAL | Status: DC | PRN

## 2017-01-24 MED ORDER — GUAIFENESIN ER 600 MG PO TB12
600.0000 mg | ORAL_TABLET | Freq: Two times a day (BID) | ORAL | Status: DC
  Administered 2017-01-24 – 2017-01-28 (×9): 600 mg via ORAL
  Filled 2017-01-24 (×9): qty 1

## 2017-01-24 MED ORDER — LEVOFLOXACIN 500 MG PO TABS
500.0000 mg | ORAL_TABLET | Freq: Every day | ORAL | Status: DC
  Administered 2017-01-24 – 2017-01-28 (×5): 500 mg via ORAL
  Filled 2017-01-24 (×5): qty 1

## 2017-01-24 MED ORDER — INSULIN ASPART 100 UNIT/ML ~~LOC~~ SOLN
0.0000 [IU] | Freq: Three times a day (TID) | SUBCUTANEOUS | Status: DC
  Administered 2017-01-24 (×2): 2 [IU] via SUBCUTANEOUS

## 2017-01-24 MED ORDER — SODIUM CHLORIDE 0.9 % IV SOLN: 250.0000 mL | INTRAVENOUS | Status: DC | PRN

## 2017-01-24 MED ORDER — MOMETASONE FURO-FORMOTEROL FUM 200-5 MCG/ACT IN AERO
2.0000 | INHALATION_SPRAY | Freq: Two times a day (BID) | RESPIRATORY_TRACT | Status: DC
  Administered 2017-01-25 – 2017-01-28 (×7): 2 via RESPIRATORY_TRACT
  Filled 2017-01-24 (×3): qty 8.8

## 2017-01-24 MED ORDER — SODIUM CHLORIDE 0.9% FLUSH
3.0000 mL | Freq: Two times a day (BID) | INTRAVENOUS | Status: DC
  Administered 2017-01-25 – 2017-01-27 (×5): 3 mL via INTRAVENOUS

## 2017-01-24 MED ORDER — MOVING RIGHT ALONG BOOK
Freq: Once | Status: AC
  Administered 2017-01-24: 12:00:00
  Filled 2017-01-24: qty 1

## 2017-01-24 MED ORDER — FUROSEMIDE 10 MG/ML IJ SOLN
INTRAMUSCULAR | Status: AC
  Filled 2017-01-24: qty 2

## 2017-01-24 MED ORDER — FUROSEMIDE 10 MG/ML IJ SOLN
20.0000 mg | Freq: Once | INTRAMUSCULAR | Status: AC
  Administered 2017-01-24: 20 mg via INTRAVENOUS

## 2017-01-24 MED ORDER — FUROSEMIDE 10 MG/ML IJ SOLN
40.0000 mg | Freq: Every day | INTRAMUSCULAR | Status: DC
  Administered 2017-01-25: 40 mg via INTRAVENOUS
  Filled 2017-01-24: qty 4

## 2017-01-24 MED ORDER — HYDROCOD POLST-CPM POLST ER 10-8 MG/5ML PO SUER
5.0000 mL | Freq: Two times a day (BID) | ORAL | Status: DC
Start: 2017-01-24 — End: 2017-01-28
  Administered 2017-01-24 – 2017-01-28 (×9): 5 mL via ORAL
  Filled 2017-01-24 (×9): qty 5

## 2017-01-24 MED ORDER — SODIUM CHLORIDE 0.9% FLUSH: 3.0000 mL | INTRAVENOUS | Status: DC | PRN

## 2017-01-24 NOTE — Progress Notes (Signed)
2 Days Post-Op Procedure(s) (LRB): CORONARY ARTERY BYPASS GRAFTING (CABG) times 4 using left internal mammary artery and right greater saphenous leg vein. (N/A) TRANSESOPHAGEAL ECHOCARDIOGRAM (TEE) (N/A) Subjective: Doing well Productive cough  Objective: Vital signs in last 24 hours: Temp:  [98.1 F (36.7 C)-98.9 F (37.2 C)] 98.6 F (37 C) (03/03 1140) Pulse Rate:  [78-100] 100 (03/03 1000) Cardiac Rhythm: Normal sinus rhythm (03/03 0800) Resp:  [9-25] 25 (03/03 1000) BP: (83-126)/(54-90) 122/75 (03/03 1000) SpO2:  [90 %-96 %] 91 % (03/03 1000) FiO2 (%):  [28 %] 28 % (03/03 0843) Weight:  [250 lb 7.1 oz (113.6 kg)] 250 lb 7.1 oz (113.6 kg) (03/03 0500)  Hemodynamic parameters for last 24 hours:    Intake/Output from previous day: 03/02 0701 - 03/03 0700 In: 1350.8 [I.V.:850.8; IV Piggyback:500] Out: [id number] [Urine:1160; Drains:90; Chest Tube:400] Intake/Output this shift: Total I/O In: 118 [I.V.:68; IV Piggyback:50] Out: 360 [Urine:290; Chest Tube:70]       Exam    General- alert and comfortable   Lungs- clear without rales, wheezes   Cor- regular rate and rhythm, no murmur , gallop   Abdomen- soft, non-tender   Extremities - warm, non-tender, minimal edema   Neuro- oriented, appropriate, no focal weakness   Lab Results:  Recent Labs  01/23/17 1648 01/24/17 0410  WBC 22.1* 19.6*  HGB 10.4* 9.6*  HCT 31.8* 29.0*  PLT 149* 132*   BMET:  Recent Labs  01/23/17 0400 01/23/17 1634 01/23/17 1648 01/24/17 0410  NA 136 136  --  133*  K 4.6 4.5  --  4.2  CL 107 99*  --  101  CO2 22  --   --  26  GLUCOSE 128* 150*  --  127*  BUN 14 20  --  17  CREATININE 0.91 1.00 1.10 1.00  CALCIUM 7.8*  --   --  8.1*    PT/INR:  Recent Labs  01/22/17 1455  LABPROT 16.5*  INR 1.33   ABG    Component Value Date/Time   PHART 7.357 01/22/2017 1940   HCO3 18.7 (L) 01/22/2017 1940   TCO2 25 01/23/2017 1634   ACIDBASEDEF 6.0 (H) 01/22/2017 1940   O2SAT 95.0  01/22/2017 1940   CBG (last 3)   Recent Labs  01/24/17 0359 01/24/17 0808 01/24/17 1139  GLUCAP 123* 128* 130*    Assessment/Plan: S/P Procedure(s) (LRB): CORONARY ARTERY BYPASS GRAFTING (CABG) times 4 using left internal mammary artery and right greater saphenous leg vein. (N/A) TRANSESOPHAGEAL ECHOCARDIOGRAM (TEE) (N/A) Mobilize Diuresis Plan for transfer to step-down: see transfer orders productive cough with elevated WBC- cover withantibiotics   LOS: 4 days    [name] 01/24/2017

## 2017-01-24 NOTE — Progress Notes (Signed)
Patient arrived the unit   From  2S assessment completed see    Flow sheet, placed on tele    ccmd notified, patient  Oriented to room and staff, bed in lowest  position, call  Bell within  reach, will continue to monitor

## 2017-01-24 NOTE — Progress Notes (Signed)
Per Dr. Prescott Gum pull JP drain.  JP drain pulled. 50 cc serosanguineous fluid in bulb prior to discharge. Gauze and tape applied to site. After ambulation gauze saturated with same fluid type. Dressing replaced. Will continue to monitor. Roselyn Reef Advit Trethewey,RN

## 2017-01-25 ENCOUNTER — Inpatient Hospital Stay (HOSPITAL_COMMUNITY): Payer: BLUE CROSS/BLUE SHIELD

## 2017-01-25 LAB — BASIC METABOLIC PANEL
Anion gap: 4 — ABNORMAL LOW (ref 5–15)
BUN: 23 mg/dL — ABNORMAL HIGH (ref 6–20)
CO2: 27 mmol/L (ref 22–32)
Calcium: 8.1 mg/dL — ABNORMAL LOW (ref 8.9–10.3)
Chloride: 103 mmol/L (ref 101–111)
Creatinine, Ser: 1.04 mg/dL (ref 0.61–1.24)
GFR calc Af Amer: >60 mL/min (ref >60)
GFR calc non Af Amer: >60 mL/min (ref >60)
Glucose, Bld: 114 mg/dL — ABNORMAL HIGH (ref 65–99)
Potassium: 4.1 mmol/L (ref 3.5–5.1)
Sodium: 134 mmol/L — ABNORMAL LOW (ref 135–145)

## 2017-01-25 LAB — TYPE AND SCREEN
ABO/RH(D): O POS
Antibody Screen: NEGATIVE
Unit division: 0
Unit division: 0

## 2017-01-25 LAB — BPAM RBC
Blood Product Expiration Date: 201803282359
Blood Product Expiration Date: 201803282359
Unit Type and Rh: 5100
Unit Type and Rh: 5100

## 2017-01-25 LAB — CBC
HCT: 29.8 % — ABNORMAL LOW (ref 39.0–52.0)
Hemoglobin: 9.9 g/dL — ABNORMAL LOW (ref 13.0–17.0)
MCH: 29.9 pg (ref 26.0–34.0)
MCHC: 33.2 g/dL (ref 30.0–36.0)
MCV: 90 fL (ref 78.0–100.0)
Platelets: 127 10*3/uL — ABNORMAL LOW (ref 150–400)
RBC: 3.31 MIL/uL — ABNORMAL LOW (ref 4.22–5.81)
RDW: 13.7 % (ref 11.5–15.5)
WBC: 17.7 10*3/uL — ABNORMAL HIGH (ref 4.0–10.5)

## 2017-01-25 LAB — GLUCOSE, CAPILLARY
GLUCOSE-CAPILLARY: 119 mg/dL — AB (ref 65–99)
GLUCOSE-CAPILLARY: 131 mg/dL — AB (ref 65–99)
GLUCOSE-CAPILLARY: 128 mg/dL — AB (ref 65–99)
Glucose-Capillary: 105 mg/dL — ABNORMAL HIGH (ref 65–99)

## 2017-01-25 MED ORDER — METOLAZONE 5 MG PO TABS
5.0000 mg | ORAL_TABLET | Freq: Once | ORAL | Status: AC
  Administered 2017-01-25: 5 mg via ORAL
  Filled 2017-01-25: qty 1

## 2017-01-25 NOTE — Progress Notes (Signed)
01/25/2017 1700 Pt walked 570ft with rolling walker on RA.  Tolerated well.  Pt maintained sats. Carney Corners

## 2017-01-25 NOTE — Progress Notes (Addendum)
[address]       [place] [address]             [phone]      3 Days Post-Op Procedure(s) (LRB): CORONARY ARTERY BYPASS GRAFTING (CABG) times 4 using left internal mammary artery and right greater saphenous leg vein. (N/A) TRANSESOPHAGEAL ECHOCARDIOGRAM (TEE) (N/A) Subjective: Feels well for the most part, no specific c/o  Objective: Vital signs in last 24 hours: Temp:  [97.6 F (36.4 C)-98.6 F (37 C)] 98.2 F (36.8 C) (03/04 0508) Pulse Rate:  [80-108] 80 (03/04 0508) Cardiac Rhythm: Normal sinus rhythm (03/04 0731) Resp:  [8-25] 18 (03/04 0508) BP: (91-122)/(59-75) 109/68 (03/04 0508) SpO2:  [90 %-97 %] 97 % (03/04 0817) Weight:  [247 lb 4.8 oz (112.2 kg)] 247 lb 4.8 oz (112.2 kg) (03/04 0508)  Hemodynamic parameters for last 24 hours:    Intake/Output from previous day: 03/03 0701 - 03/04 0700 In: 158 [I.V.:108; IV Piggyback:50] Out: 850 [Urine:730; Drains:50; Chest Tube:70] Intake/Output this shift: No intake/output data recorded.  General appearance: alert, cooperative and no distress Heart: regular rate and rhythm Lungs: dim mildly in lower fields Abdomen: benign Extremities: + edema R>L LE Wound: incis healing well  Lab Results:  Recent Labs  01/24/17 0410 01/25/17 0310  WBC 19.6* 17.7*  HGB 9.6* 9.9*  HCT 29.0* 29.8*  PLT 132* 127*   BMET:  Recent Labs  01/24/17 0410 01/25/17 0310  NA 133* 134*  K 4.2 4.1  CL 101 103  CO2 26 27  GLUCOSE 127* 114*  BUN 17 23*  CREATININE 1.00 1.04  CALCIUM 8.1* 8.1*    PT/INR:  Recent Labs  01/22/17 1455  LABPROT 16.5*  INR 1.33   ABG    Component Value Date/Time   PHART 7.357 01/22/2017 1940   HCO3 18.7 (L) 01/22/2017 1940   TCO2 25 01/23/2017 1634   ACIDBASEDEF 6.0 (H) 01/22/2017 1940   O2SAT 95.0 01/22/2017 1940   CBG (last 3)   Recent Labs  01/24/17 2049 01/24/17 2317 01/25/17 0557  GLUCAP 128* 121* 105*    Meds Scheduled Meds: . acetaminophen   1,000 mg Oral Q6H   Or  . acetaminophen (TYLENOL) oral liquid 160 mg/5 mL  1,000 mg Per Tube Q6H  . aspirin EC  325 mg Oral Daily   Or  . aspirin  324 mg Per Tube Daily  . atorvastatin  40 mg Oral q1800  . bisacodyl  10 mg Oral Daily   Or  . bisacodyl  10 mg Rectal Daily  . chlorpheniramine-HYDROcodone  5 mL Oral Q12H  . docusate sodium  200 mg Oral Daily  . furosemide  40 mg Intravenous Daily  . guaiFENesin  600 mg Oral BID  . insulin aspart  0-24 Units Subcutaneous TID AC & HS  . levofloxacin  500 mg Oral Daily  . metolazone  5 mg Oral Once  . metoprolol tartrate  12.5 mg Oral BID   Or  . metoprolol tartrate  12.5 mg Per Tube BID  . mometasone-formoterol  2 puff Inhalation BID  . pantoprazole  40 mg Oral Daily  . potassium chloride (KCL MULTIRUN) 30 mEq in 265 mL IVPB  30 mEq Intravenous Once  . sodium chloride flush  3 mL Intravenous Q12H  . sodium chloride flush  3 mL Intravenous Q12H   Continuous Infusions: . sodium chloride Stopped (01/23/17 1200)  . sodium chloride    . sodium chloride Stopped (01/23/17 1200)  PRN Meds:.sodium chloride, sodium chloride, magnesium hydroxide, metoprolol, ondansetron (ZOFRAN) IV, oxyCODONE, sodium chloride flush, sodium chloride flush, traMADol, zolpidem  Xrays Dg Chest 2 View  Result Date: 01/25/2017 CLINICAL DATA:  S/p CABG 01/22/17; non-smoker EXAM: CHEST  2 VIEW COMPARISON:  01/24/2017 FINDINGS: Right IJ sheath has been removed. Status post median sternotomy and CABG. Moderate right pneumothorax is unchanged. Left-sided chest tube has been removed. There is minimal bibasilar atelectasis. Heart is enlarged. No overt edema. IMPRESSION: Stable appearance of moderate right pneumothorax. Electronically Signed   By: Nolon Nations M.D.   On: 01/25/2017 08:34   Dg Chest Port 1 View  Result Date: 01/24/2017 CLINICAL DATA:  CABG. EXAM: PORTABLE CHEST 1 VIEW COMPARISON:  01/23/2017 FINDINGS: Sequelae of CABG are again identified. Swan-Ganz  catheter has been removed. Right jugular sheath remains in place. Mediastinal drain and left chest tube also remain. The cardiac silhouette remains enlarged. Small to moderate right pneumothorax has minimally increased in size. No left-sided pneumothorax is identified. There is new mild medial right basilar opacity. Patchy left basilar opacity has mildly improved. No sizable pleural effusion is seen. IMPRESSION: 1. Minimally increased size of right pneumothorax. 2. New mild right basilar opacity, likely atelectasis. 3. Improved aeration of the left lung base. Electronically Signed   By: Logan Bores M.D.   On: 01/24/2017 08:50    Assessment/Plan: S/P Procedure(s) (LRB): CORONARY ARTERY BYPASS GRAFTING (CABG) times 4 using left internal mammary artery and right greater saphenous leg vein. (N/A) TRANSESOPHAGEAL ECHOCARDIOGRAM (TEE) (N/A)   1 hemodyn stable in sinus rhythm 2 leukocytosis is improving- conts levaquin, push pulm toilet as able 3 H/H is stable 4 renal fxn is stable 5 cont rehab modalities 6 cont to diurese for volume overload 7 wean O2 off  LOS: 5 days    GOLD,WAYNE E 01/25/2017  cont diuresis and wound care for leg DC Tuesday- leave leg sutures in place patient examined and medical record reviewed,agree with above note. Tharon Aquas Trigt III 01/25/2017

## 2017-01-25 NOTE — Progress Notes (Signed)
01/25/2017 1130 Pt walked with rolling walker on 2L O2 573ft.  Pt tolerated well. Carney Corners

## 2017-01-26 DIAGNOSIS — I208 Other forms of angina pectoris: Secondary | ICD-10-CM

## 2017-01-26 DIAGNOSIS — Z951 Presence of aortocoronary bypass graft: Secondary | ICD-10-CM

## 2017-01-26 HISTORY — DX: Presence of aortocoronary bypass graft: Z95.1

## 2017-01-26 LAB — GLUCOSE, CAPILLARY
Glucose-Capillary: 131 mg/dL — ABNORMAL HIGH (ref 65–99)
Glucose-Capillary: 104 mg/dL — ABNORMAL HIGH (ref 65–99)

## 2017-01-26 MED ORDER — ACETAMINOPHEN 500 MG PO TABS: 1000.0000 mg | ORAL_TABLET | Freq: Four times a day (QID) | ORAL | 0 refills | Status: DC | PRN

## 2017-01-26 MED ORDER — POTASSIUM CHLORIDE CRYS ER 20 MEQ PO TBCR: 20.0000 meq | EXTENDED_RELEASE_TABLET | Freq: Every day | ORAL | 0 refills | Status: DC

## 2017-01-26 MED ORDER — TRAMADOL HCL 50 MG PO TABS: 50.0000 mg | ORAL_TABLET | ORAL | 0 refills | Status: DC | PRN

## 2017-01-26 MED ORDER — FUROSEMIDE 40 MG PO TABS
40.0000 mg | ORAL_TABLET | Freq: Every day | ORAL | Status: DC
  Administered 2017-01-26 – 2017-01-28 (×3): 40 mg via ORAL
  Filled 2017-01-26 (×3): qty 1

## 2017-01-26 MED ORDER — POTASSIUM CHLORIDE CRYS ER 20 MEQ PO TBCR
20.0000 meq | EXTENDED_RELEASE_TABLET | Freq: Every day | ORAL | Status: DC
  Administered 2017-01-26 – 2017-01-28 (×3): 20 meq via ORAL
  Filled 2017-01-26 (×3): qty 1

## 2017-01-26 MED ORDER — LEVOFLOXACIN 500 MG PO TABS: 500.0000 mg | ORAL_TABLET | Freq: Every day | ORAL | 0 refills | Status: DC

## 2017-01-26 MED ORDER — GUAIFENESIN ER 600 MG PO TB12: 600.0000 mg | ORAL_TABLET | Freq: Two times a day (BID) | ORAL | Status: DC | PRN

## 2017-01-26 MED ORDER — FUROSEMIDE 40 MG PO TABS: 40.0000 mg | ORAL_TABLET | Freq: Every day | ORAL | 0 refills | Status: DC

## 2017-01-26 MED ORDER — ATORVASTATIN CALCIUM 40 MG PO TABS: 40.0000 mg | ORAL_TABLET | Freq: Every day | ORAL | 3 refills | Status: DC

## 2017-01-26 MED ORDER — METOPROLOL TARTRATE 25 MG PO TABS: 12.5000 mg | ORAL_TABLET | Freq: Two times a day (BID) | ORAL | 3 refills | Status: DC

## 2017-01-26 MED ORDER — ASPIRIN 325 MG PO TBEC: 325.0000 mg | DELAYED_RELEASE_TABLET | Freq: Every day | ORAL | 0 refills | Status: DC

## 2017-01-26 MED ORDER — LACTULOSE 10 GM/15ML PO SOLN
20.0000 g | Freq: Every day | ORAL | Status: DC | PRN
  Administered 2017-01-26: 20 g via ORAL
  Filled 2017-01-26: qty 30

## 2017-01-26 MED ORDER — METOPROLOL TARTRATE 12.5 MG HALF TABLET
12.5000 mg | ORAL_TABLET | Freq: Once | ORAL | Status: AC
  Administered 2017-01-26: 12.5 mg via ORAL

## 2017-01-26 NOTE — Progress Notes (Addendum)
      [address]       [place] [address]             [phone]      4 Days Post-Op Procedure(s) (LRB): CORONARY ARTERY BYPASS GRAFTING (CABG) times 4 using left internal mammary artery and right greater saphenous leg vein. (N/A) TRANSESOPHAGEAL ECHOCARDIOGRAM (TEE) (N/A)   Subjective:  Mr. Canela has no complaints.  States he is feeling pretty good.  + ambulation  + BM  Objective: Vital signs in last 24 hours: Temp:  [98.7 F (37.1 C)] 98.7 F (37.1 C) (03/05 0645) Pulse Rate:  [85-106] 94 (03/05 0645) Cardiac Rhythm: Sinus tachycardia (03/04 1900) Resp:  [18-20] 18 (03/05 0645) BP: (92-102)/(59-71) 98/60 (03/05 0645) SpO2:  [91 %-100 %] 91 % (03/05 0645) Weight:  [241 lb 6.4 oz (109.5 kg)] 241 lb 6.4 oz (109.5 kg) (03/05 0645)  Intake/Output from previous day: 03/04 0701 - 03/05 0700 In: 3 [I.V.:3] Out: -   General appearance: alert, cooperative and no distress Heart: regular rate and rhythm Lungs: clear to auscultation bilaterally Abdomen: soft, non-tender; bowel sounds normal; no masses,  no organomegaly Extremities: edema trace Wound: clean and dry, sutures remain in place  Lab Results:  Recent Labs  01/24/17 0410 01/25/17 0310  WBC 19.6* 17.7*  HGB 9.6* 9.9*  HCT 29.0* 29.8*  PLT 132* 127*   BMET:  Recent Labs  01/24/17 0410 01/25/17 0310  NA 133* 134*  K 4.2 4.1  CL 101 103  CO2 26 27  GLUCOSE 127* 114*  BUN 17 23*  CREATININE 1.00 1.04  CALCIUM 8.1* 8.1*    PT/INR: No results for input(s): LABPROT, INR in the last 72 hours. ABG    Component Value Date/Time   PHART 7.357 01/22/2017 1940   HCO3 18.7 (L) 01/22/2017 1940   TCO2 25 01/23/2017 1634   ACIDBASEDEF 6.0 (H) 01/22/2017 1940   O2SAT 95.0 01/22/2017 1940   CBG (last 3)   Recent Labs  01/25/17 1628 01/25/17 2122 01/26/17 0615  GLUCAP 131* 128* 131*    Assessment/Plan: S/P Procedure(s) (LRB): CORONARY ARTERY BYPASS GRAFTING (CABG) times 4 using  left internal mammary artery and right greater saphenous leg vein. (N/A) TRANSESOPHAGEAL ECHOCARDIOGRAM (TEE) (N/A)  1. CV- NSR, BP labile on low dose lopressor, d/c EPW 2. Pulm- no acute issues, continue IS 3. Renal- creatinine, weight is trending down, continue Lasix 4. Leukocytosis, improving- continue Levaquin 5. GI- constipation, will order lactulose  6. CBGs- controlled, not a diabetic, will d/c insulin 7 Dispo- patient stable, continue low dose BB, d/c EPW, continue Lasix, home later today vs. Tomorrow AM   LOS: 6 days    BARRETT, ERIN 01/26/2017  keep in hospital another day to monitor leg wounds, diuresis  patient examined and medical record reviewed,agree with above note. Tharon Aquas Trigt III 01/26/2017

## 2017-01-26 NOTE — Discharge Instructions (Signed)
Coronary Artery Bypass Grafting, Care After ° °This sheet gives you information about how to care for yourself after your procedure. Your health care provider may also give you more specific instructions. If you have problems or questions, contact your health care provider. °What can I expect after the procedure? °After the procedure, it is common to have: °· Nausea and a lack of appetite. °· Constipation. °· Weakness and fatigue. °· Depression or irritability. °· Pain or discomfort in your incision areas. °Follow these instructions at home: °Medicines  °· Take over-the-counter and prescription medicines only as told by your health care provider. Do not stop taking medicines or start any new medicines without approval from your health care provider. °· If you were prescribed an antibiotic medicine, take it as told by your health care provider. Do not stop taking the antibiotic even if you start to feel better. °· Do not drive or use heavy machinery while taking prescription pain medicine. °Incision care  °· Follow instructions from your health care provider about how to take care of your incisions. Make sure you: °¨ Wash your hands with soap and water before you change your bandage (dressing). If soap and water are not available, use hand sanitizer. °¨ Change your dressing as told by your health care provider. °¨ Leave stitches (sutures), skin glue, or adhesive strips in place. These skin closures may need to stay in place for 2 weeks or longer. If adhesive strip edges start to loosen and curl up, you may trim the loose edges. Do not remove adhesive strips completely unless your health care provider tells you to do that. °· Keep incision areas clean, dry, and protected. °· Check your incision areas every day for signs of infection. Check for: °¨ More redness, swelling, or pain. °¨ More fluid or blood. °¨ Warmth. °¨ Pus or a bad smell. °· If incisions were made in your legs: °¨ Avoid crossing your legs. °¨ Avoid  sitting for long periods of time. Change positions every 30 minutes. °¨ Raise (elevate) your legs when you are sitting. °Bathing  °· Do not take baths, swim, or use a hot tub until your health care provider approves. °· Only take sponge baths. Pat the incisions dry. Do not rub incisions with a washcloth or towel. °· Ask your health care provider when you can shower. °Eating and drinking  °· Eat foods that are high in fiber, such as raw fruits and vegetables, whole grains, beans, and nuts. Meats should be lean cut. Avoid canned, processed, and fried foods. This can help prevent constipation and is a recommended part of a heart-healthy diet. °· Drink enough fluid to keep your urine clear or pale yellow. °· Limit alcohol intake to no more than 1 drink a day for nonpregnant women and 2 drinks a day for men. One drink equals 12 oz of beer, 5 oz of wine, or 1½ oz of hard liquor. °Activity  °· Rest and limit your activity as told by your health care provider. You may be instructed to: °¨ Stop any activity right away if you have chest pain, shortness of breath, irregular heartbeats, or dizziness. Get help right away if you have any of these symptoms. °¨ Move around frequently for short periods or take short walks as directed by your health care provider. Gradually increase your activities. You may need physical therapy or cardiac rehabilitation to help strengthen your muscles and build your endurance. °¨ Avoid lifting, pushing, or pulling anything that is heavier than 10   4.5 kg) for at least 6 weeks or as told by your health care provider.  Do not drive until your health care provider approves.  Ask your health care provider when you may return to work.  Ask your health care provider when you may resume sexual activity. General instructions   Do not use any products that contain nicotine or tobacco, such as cigarettes and e-cigarettes. If you need help quitting, ask your health care provider.  Take 2-3 deep  breaths every few hours during the day, while you recover. This helps expand your lungs and prevent complications like pneumonia after surgery.  If you were given a device called an incentive spirometer, use it several times a day to practice deep breathing. Support your chest with a pillow or your arms when you take deep breaths or cough.  Wear compression stockings as told by your health care provider. These stockings help to prevent blood clots and reduce swelling in your legs.  Weigh yourself every day. This helps identify if your body is holding (retaining) fluid that may make your heart and lungs work harder.  Keep all follow-up visits as told by your health care provider. This is important. Contact a health care provider if:  You have more redness, swelling, or pain around any incision.  You have more fluid or blood coming from any incision.  Any incision feels warm to the touch.  You have pus or a bad smell coming from any incision  You have a fever.  You have swelling in your ankles or legs.  You have pain in your legs.  You gain 2 lb (0.9 kg) or more a day.  You are nauseous or you vomit.  You have diarrhea. Get help right away if:  You have chest pain that spreads to your jaw or arms.  You are short of breath.  You have a fast or irregular heartbeat.  You notice a "clicking" in your breastbone (sternum) when you move.  You have numbness or weakness in your arms or legs.  You feel dizzy or light-headed. Summary  After the procedure, it is common to have pain or discomfort in the incision areas.  Do not take baths, swim, or use a hot tub until your health care provider approves.  Gradually increase your activities. You may need physical therapy or cardiac rehabilitation to help strengthen your muscles and build your endurance.  Weigh yourself every day. This helps identify if your body is holding (retaining) fluid that may make your heart and lungs work  harder. This information is not intended to replace advice given to you by your health care provider. Make sure you discuss any questions you have with your health care provider. Document Released: 05/30/2005 Document Revised: 09/29/2016 Document Reviewed: 09/29/2016 Elsevier Interactive Patient Education  2017 [employer] Sutures are stitches that can be used to close wounds. Taking care of your wound properly can help to prevent pain and infection. It can also help your wound to heal more quickly. How is this treated? Wound Care   Keep the wound clean and dry.  If you were given a bandage (dressing), you should change it at least once per day or as directed by your health care provider. You should also change it if it becomes wet or dirty.  Keep the wound completely dry for the first 24 hours or as directed by your health care provider. After that time, you may shower or bathe. However, make  sure that the wound is not soaked in water until the sutures have been removed.  Clean the wound one time each day or as directed by your health care provider.  Wash the wound with soap and water.  Rinse the wound with water to remove all soap.  Pat the wound dry with a clean towel. Do not rub the wound.  Aftercleaning the wound, apply a thin layer of antibioticointment as directed by your health care provider. This will help to prevent infection and keep the dressing from sticking to the wound.  Have the sutures removed as directed by your health care provider. General Instructions   Take or apply medicines only as directed by your health care provider.  To help prevent scarring, make sure to cover your wound with sunscreen whenever you are outside after the sutures are removed and the wound is healed. Make sure to wear a sunscreen of at least 30 SPF.  If you were prescribed an antibiotic medicine or ointment, finish all of it even if you start to feel better.  Do  not scratch or pick at the wound.  Keep all follow-up visits as directed by your health care provider. This is important.  Check your wound every day for signs of infection. Watch for:  Redness, swelling, or pain.  Fluid, blood, or pus.  Raise (elevate) the injured area above the level of your heart while you are sitting or lying down, if possible.  Avoid stretching your wound.  Drink enough fluids to keep your urine clear or pale yellow. Contact a health care provider if:  You received a tetanus shot and you have swelling, severe pain, redness, or bleeding at the injection site.  You have a fever.  A wound that was closed breaks open.  You notice a bad smell coming from the wound.  You notice something coming out of the wound, such as wood or glass.  Your pain is not controlled with medicine.  You have increased redness, swelling, or pain at the site of your wound.  You have fluid, blood, or pus coming from your wound.  You notice a change in the color of your skin near your wound.  You need to change the dressing frequently due to fluid, blood, or pus draining from the wound.  You develop a new rash.  You develop numbness around the wound. Get help right away if:  You develop severe swelling around the injury site.  Your pain suddenly increases and is severe.  You develop painful lumps near the wound or on skin that is anywhere on your body.  You have a red streak going away from your wound.  The wound is on your hand or foot and you cannot properly move a finger or toe.  The wound is on your hand or foot and you notice that your fingers or toes look pale or bluish. This information is not intended to replace advice given to you by your health care provider. Make sure you discuss any questions you have with your health care provider. Document Released: 12/18/2004 Document Revised: 04/17/2016 Document Reviewed: 06/22/2013 Elsevier Interactive Patient Education   2017 [employer].

## 2017-01-26 NOTE — Discharge Summary (Signed)
Physician Discharge Summary  Patient ID: [name] MRN: [id number] DOB/AGE: 59-Sep-1957 59 y.o.  Admit date: 01/20/2017 Discharge date: 01/28/2017  Admission Diagnoses:  Patient Active Problem List   Diagnosis Date Noted  . Chest pain 01/20/2017  . Non-ST elevation (NSTEMI) myocardial infarction ([place])   . Acute esophagitis 08/24/2014  . Gastric erosions 08/24/2014  . Duodenal ulcer hemorrhagic 08/24/2014  . GI bleed due to NSAIDs 08/23/2014  . UGI bleed 08/23/2014  . HYPERLIPIDEMIA 10/03/2008  . PEPTIC ULCER, ACUTE, HEMORRHAGE, HX OF 10/03/2008   Discharge Diagnoses:   Patient Active Problem List   Diagnosis Date Noted  . S/P CABG x 4 01/26/2017  . Chest pain 01/20/2017  . Non-ST elevation (NSTEMI) myocardial infarction ([facility])   . Acute esophagitis 08/24/2014  . Gastric erosions 08/24/2014  . Duodenal ulcer hemorrhagic 08/24/2014  . GI bleed due to NSAIDs 08/23/2014  . UGI bleed 08/23/2014  . HYPERLIPIDEMIA 10/03/2008  . PEPTIC ULCER, ACUTE, HEMORRHAGE, HX OF 10/03/2008   Discharged Condition: good  History of Present Illness:  [name] is a 61 yo white male with positive family history of CAD.  He presented to the ED on 01/20/2017 with complaints of chest pain that developed at rest.  EKG showed non-specific ST changes in the inferior lead.  Cardiac enzymes were mildly elevated.  The patient was admitted for further care.  Hospital Course:   He underwent cardiac catheterization on day of admission and was found to have multivessel CAD with a preserved EF.  It was felt coronary bypass grafting would be indicated and TCTS consult was obtained.  He was evaluated by Dr. Prescott Gum who was in agreement with proceeding with coronary bypass grafting procedure.  The risks and benefits of the procedure were explained to the patient and he was agreeable to proceed.  The patient remained chest pain free prior to proceeding with the procedure.  He was taken to the operating room  on 01/22/2017.  He underwent CABG x 4 utilizing LIMA to LAD, SVG to PDA, SVG to Ramus, and SVG to diagonal.  He also underwent endoscopic harvest of greater saphenous vein, however this was converted to an open procedure due to lack of visualization due to bleeding.  He tolerated the procedure without difficulty and was taken to the SICU in stable condition.  He was extubate the evening of surgery.  During his stay in the SICU the patient was weaned off Milrinone as tolerated.  Follow up CXR showed development of a 15% pneumothorax on the right.  This resolved on subsequent CXRs.  The patient's chest tubes and arterial lines were removed without difficulty.  His JP drain was removed from his RLE once output was reduced.  He developed productive cough and was started on empiric Levaquin due to mild leukocytosis.  He was maintaining NSR and was felt medically stable for transfer to the step down unit on POD #2.  The patient continues to make progress.  His pacing wires have been removed without difficulty.  However he developed Atrial Fibrillation overnight.  He converted with use of IV Amiodarone.  He continues to maintain NSR.   He is ambulating independently.  He is tolerating a heart healthy diet.  His pain is well controlled.  He is medically stable for discharge home today.      Significant Diagnostic Studies: angiography:   1. Severe multivessel coronary artery disease with critical stenosis of the distal RCA and mid LAD, severe stenosis of the ramus intermedius and diagonal  branches, and moderate stenosis of the distal circumflex 2. Moderate segmental LV systolic dysfunction with akinesis of the basal and midinferior walls  Recommendation: The patient is chest pain-free. The RCA appears to be his culprit vessel with critical stenosis and a corresponding wall motion abnormality on ventriculography. With severe multivessel CAD and LV dysfunction, I think he would receive mortality benefit and better  long-term symptom relief from multivessel CABG. A cardiac surgery consultation will be placed.   Treatments: surgery:   CORONARY ARTERY BYPASS GRAFTING x 4 -LIMA to LAD -SVG to DIAGONAL -SVG to RAMUS -SVG to PDA  ENDOSCOPIC/OPEN HARVEST OF GREATER SAPHENOUS VEIN -Right Leg  TRANSESOPHAGEAL ECHOCARDIOGRAM (TEE) (N/A)  Disposition: 01-Home or Self Care   Discharge Medications:  The patient has been discharged on:   1.Beta Blocker:  Yes [ x  ]                              No   [   ]                              If No, reason:  2.Ace Inhibitor/ARB: Yes [   ]                                     No  [ x   ]                                     If No, reason: labile BP  3.Statin:   Yes [ x  ]                  No  [   ]                  If No, reason:  4.Ecasa:  Yes  [  x ]                  No   [   ]                  If No, reason:  Allergies as of 01/28/2017      Reactions   Ibuprofen Other (See Comments)   Severe GI Bleed and PUD       Allergies as of 01/28/2017      Reactions   Ibuprofen Other (See Comments)   Severe GI Bleed and PUD        Medication List    STOP taking these medications   doxycycline 100 MG capsule Commonly known as:  VIBRAMYCIN   PRESCRIPTION MEDICATION   PRESCRIPTION MEDICATION     TAKE these medications   acetaminophen 500 MG tablet Commonly known as:  TYLENOL Take 2 tablets (1,000 mg total) by mouth every 6 (six) hours as needed for mild pain or fever.   amiodarone 200 MG tablet Commonly known as:  PACERONE Take 2 tablets (400 mg total) by mouth 2 (two) times daily. For 7 days, then decrease to 200 mg BID   aspirin 325 MG EC tablet Take 1 tablet (325 mg total) by mouth daily.   atorvastatin 40 MG tablet Commonly known as:  LIPITOR Take 1 tablet (40 mg total) by mouth  daily at 6 PM.   CO Q 10 PO Take 1 tablet by mouth daily.   furosemide 40 MG tablet Commonly known as:  LASIX Take 1 tablet (40 mg total) by mouth daily.  For 7 Days   guaiFENesin 600 MG 12 hr tablet Commonly known as:  MUCINEX Take 1 tablet (600 mg total) by mouth 2 (two) times daily as needed.   levofloxacin 500 MG tablet Commonly known as:  LEVAQUIN Take 1 tablet (500 mg total) by mouth daily.   metoprolol tartrate 25 MG tablet Commonly known as:  LOPRESSOR Take 1 tablet (25 mg total) by mouth 2 (two) times daily.   OMEGA 3 PO Take 2 capsules by mouth daily.   oseltamivir 75 MG capsule Commonly known as:  TAMIFLU Take 75 mg by mouth 2 (two) times daily.   OVER THE COUNTER MEDICATION Take 3 tablets by mouth daily. Nitrulline: Nitric Oxide Potentiator supplement.   pantoprazole 40 MG tablet Commonly known as:  PROTONIX Take 1 tab every morning , daily.   potassium chloride SA 20 MEQ tablet Commonly known as:  K-DUR,KLOR-CON Take 1 tablet (20 mEq total) by mouth daily. For 7 Days   traMADol 50 MG tablet Commonly known as:  ULTRAM Take 1-2 tablets (50-100 mg total) by mouth every 4 (four) hours as needed for moderate pain.   ZINC OXIDE PO Take 1 tablet by mouth daily.            Discharge Instructions    Amb Referral to Cardiac Rehabilitation    Complete by:  As directed    Diagnosis:  CABG   CABG X ___:  4     Follow-up Information    [facility] Follow up on 02/05/2017.   Specialty:  Cardiothoracic Surgery Why:  Appointment is at 10:15 for suture removal Contact information: [address] [place], [place] Rodessa [place] III, MD Follow up on 02/25/2017.   Specialty:  Cardiothoracic Surgery Why:  Appointment is at 2:30, Please get CXR at 2:00 located at [facility] on the first floor of our office building Contact information: [place] [address] [phone]        Ermalinda Barrios, PA-C Follow up on 02/12/2017.   Specialty:  Cardiology Why:  Appointment is at  11:45 Contact information: [address] [place] [address] [phone]           Signed: Ellwood Handler 01/28/2017, 7:46 AM

## 2017-01-26 NOTE — Progress Notes (Signed)
01/26/2017 1100 EPW D/C'd per order and per protocol.  Ends intact. Pt. Tolerated well.  Advised bedrest x1hr.  Call bell in reach.  Vital signs collected per protocol. [place]

## 2017-01-26 NOTE — Progress Notes (Signed)
Pt walking with wife and RW earlier. No c/o, increased distance. Sts he will walk without RW later to ensure he doesn't need one. Ed completed with pt and son. Voiced understanding. Eager to go home. Will send referral to [place]. [place], ACSM 2:12 PM 01/26/2017

## 2017-01-26 NOTE — Op Note (Signed)
[name], [name] NO.:  [id number]  MEDICAL RECORD NO.:  [id number]  LOCATION:                                 FACILITY:  PHYSICIAN:  Ivin Poot, M.D.  DATE OF BIRTH:  11/16/1956  DATE OF PROCEDURE:  01/22/2017 DATE OF DISCHARGE:                              OPERATIVE REPORT   OPERATION: 1. Coronary artery bypass grafting x4 (left internal mammary artery to     left anterior descending, saphenous vein graft to diagonal,     saphenous vein graft to ramus intermediate, saphenous vein graft to     posterior descending). 2. Endoscopic as well as open harvest of right leg greater saphenous     vein.  SURGEON:  Ivin Poot, M.D.  ASSISTANT:  Ellwood Handler, PA-C.  PREOPERATIVE DIAGNOSES:  Preoperative non-ST elevation myocardial infarction, severe three-vessel coronary artery disease, mild-moderate LV dysfunction.  POSTOPERATIVE DIAGNOSES:  Preoperative non-ST elevation myocardial infarction, severe three-vessel coronary artery disease, mild-moderate LV dysfunction.  ANESTHESIA:  General by Dr. __________ Orene Desanctis.  INDICATIONS:  The patient is a 61 year old obese male, who presented to the hospital with symptoms of unstable angina and positive cardiac enzymes.  Cardiac catheterization was performed and the patient had been placed on heparin.  He was found to have severe multivessel coronary artery disease with mild-moderate LV dysfunction.  LVEDP was mildly elevated.  His troponin is slowly reduced in quantity and he remained asymptomatic on IV heparin.  Surgical coronary revascularization was recommended by his cardiologist.  I examined the patient and reviewed the results of the cardiac catheterization with the patient and his family.  I discussed the role of CABG to treat his severe three-vessel CAD.  I discussed the details of surgery including the use of general anesthesia and cardiopulmonary bypass, the expected postoperative recovery,  and the potential risks of stroke, bleeding, blood transfusion, postoperative infection, postoperative pulmonary problems including pleural effusion, postoperative infection, and death.  After reviewing these issues, he demonstrated his understanding and agreed to proceed with surgery under what I felt was an informed consent.  OPERATIVE FINDINGS: 1. Severely diseased coronaries with difficult targets. 2. Distal circumflex vessels were too small to graft. 3. Difficult exposure of the heart due to obese body habitus and deep     chest. 4. No packed cell transfusions required for the surgery. 5. Endoscopic harvest was converted to an open technique because of     bleeding in the endo vein tunnel and poor visibility.  DESCRIPTION OF PROCEDURE:  The patient was brought to the operating room and placed supine on the operating table where general anesthesia was induced under invasive hemodynamic monitoring.  The chest, abdomen, and legs were prepped with Betadine and draped as a sterile field.  A proper time-out was performed.  A sternal incision was made as the saphenous vein was harvested from the right leg.  The left internal mammary artery was harvested as a pedicle graft from its origin at the subclavian vessels.  It was a good vessel, 1.5 mm with excellent flow.  The sternal retractor was placed using the deep blades because of the patient's obese body habitus.  The pericardium was opened and  suspended. Pursestrings were placed in the ascending aorta and right atrium.  After the vein was harvested, heparin was administered and the ACT was documented as being therapeutic.  The patient was cannulated and placed on cardiopulmonary bypass.  The coronaries were identified for grafting. The mammary artery and vein grafts were prepared for the distal anastomoses.  Cardioplegia cannulas were placed both antegrade and retrograde cold blood cardioplegia.  The patient was cooled to 32 degrees.   Aortic crossclamp was applied.  One liter of cold blood cardioplegia was delivered in split doses between the antegrade aortic and retrograde coronary sinus catheters.  There was good cardioplegic arrest and septal temperature dropped less than 12 degrees. Cardioplegia was delivered every 20 minutes.  The distal coronary anastomoses were performed.  The first distal anastomosis was the posterior descending somewhat distally.  There was severe proximal disease of at least 95%.  A reverse saphenous vein was sewn end-to-side with running 7-0 Prolene with good flow through the graft.  Cardioplegia was redosed.  The second distal anastomosis was the ramus intermediate branch of the left circumflex.  This had a proximal 80% stenosis.  This was intramyocardial.  It was 1.5 mm vessel.  A reverse saphenous vein was sewn end-to-side with running 7-0 Prolene good flow through the graft. Cardioplegia was redosed.  The third distal anastomosis was to the diagonal branch to the LAD. This was a large 1.5 to 1.7-mm vessel ostial 90% stenosis.  A reverse saphenous vein was sewn end-to-side with running 7-0 Prolene with good flow through the graft.  Cardioplegia was redosed.  The fourth distal anastomosis was the distal third of the LAD.  The LAD had diffuse calcified disease.  The left IMA pedicle was brought through an opening and the left lateral pericardium was brought down onto the LAD and sewn end-to-side with running 8-0 Prolene.  There was good flow through the anastomosis after briefly releasing the pedicle bulldog on the mammary artery.  The bulldog was reapplied and the pedicle was secured to the epicardium with 6-0 Prolene.  Cardioplegia was redosed.  While the crossclamp was still in place, 3 proximal vein anastomoses were performed using a 4.5-mm punch with running 6-0 Prolene.  Prior to removing the crossclamp, air was vented from the coronaries with a dose of retrograde warm blood  cardioplegia.  The crossclamp was removed.  The heart resumed a spontaneous rhythm.  The vein grafts were de-aired and opened.  Each had good flow and hemostasis was documented at the proximal distal anastomoses.  The cardioplegia cannulas were removed. Temporary pacing wires were applied.  The patient was rewarmed and reperfused.  The lungs were expanded.  The ventilator was resumed.  When the patient had been adequately reperfused, he was weaned from cardiopulmonary bypass without difficulty on low-dose milrinone.  Echo showed improved global LV function.  The patient was in a non paced rhythm.  It was a sinus rhythm.  The patient remained stable and cardiac output was greater than 5.5 L/minute.  Protamine was administered without adverse reaction.  The cannulas were removed.  The mediastinum was irrigated with warm saline.  The superior pericardial fat was closed over the aorta and vein grafts.  Anterior mediastinal and left pleural chest tube were placed and brought out through separate incisions.  The sternum was closed with wire.  The pectoralis fascia was closed with a running #1 Vicryl.  The subcutaneous and skin layers were closed in running Vicryl.  Total cardiopulmonary bypass time was 130  minutes.     Ivin Poot, M.D.   ______________________________ Ivin Poot, M.D.    PV/MEDQ  D:  01/23/2017  T:  01/24/2017  Job:  [id number]

## 2017-01-26 NOTE — Progress Notes (Signed)
Progress Note  Patient Name: [name] Date of Encounter: 01/26/2017  Primary Cardiologist: Irish Lack  Subjective   61 yo admitted with CP Cath revealed significant 3 V cad Has had CABG. Progressing well A bit tachycardic today    Inpatient Medications    Scheduled Meds: . acetaminophen  1,000 mg Oral Q6H   Or  . acetaminophen (TYLENOL) oral liquid 160 mg/5 mL  1,000 mg Per Tube Q6H  . aspirin EC  325 mg Oral Daily   Or  . aspirin  324 mg Per Tube Daily  . atorvastatin  40 mg Oral q1800  . bisacodyl  10 mg Oral Daily   Or  . bisacodyl  10 mg Rectal Daily  . chlorpheniramine-HYDROcodone  5 mL Oral Q12H  . docusate sodium  200 mg Oral Daily  . furosemide  40 mg Oral Daily  . guaiFENesin  600 mg Oral BID  . levofloxacin  500 mg Oral Daily  . metoprolol tartrate  12.5 mg Oral BID   Or  . metoprolol tartrate  12.5 mg Per Tube BID  . mometasone-formoterol  2 puff Inhalation BID  . pantoprazole  40 mg Oral Daily  . potassium chloride  20 mEq Oral Daily  . sodium chloride flush  3 mL Intravenous Q12H   Continuous Infusions: . sodium chloride Stopped (01/23/17 1200)  . sodium chloride    . sodium chloride Stopped (01/23/17 1200)   PRN Meds: sodium chloride, sodium chloride, lactulose, metoprolol, ondansetron (ZOFRAN) IV, oxyCODONE, sodium chloride flush, traMADol, zolpidem   Vital Signs    Vitals:   01/25/17 1950 01/26/17 0645 01/26/17 0941 01/26/17 1058  BP: (!) 102/59 98/60  (!) 147/75  Pulse: 97 94  (!) 103  Resp: 18 18    Temp: 98.7 F (37.1 C) 98.7 F (37.1 C)    TempSrc: Oral Oral    SpO2: 93% 91% 93% 93%  Weight:  241 lb 6.4 oz (109.5 kg)    Height:        Intake/Output Summary (Last 24 hours) at 01/26/17 1103 Last data filed at 01/25/17 1115  Gross per 24 hour  Intake                3 ml  Output                0 ml  Net                3 ml   Filed Weights   01/24/17 0500 01/25/17 0508 01/26/17 0645  Weight: 250 lb 7.1 oz (113.6 kg) 247  lb 4.8 oz (112.2 kg) 241 lb 6.4 oz (109.5 kg)    Telemetry    Sinus tach  - Personally Reviewed  ECG    Sinus tach  - Personally Reviewed  Physical Exam   GEN: No acute distress.   Neck: No JVD Cardiac: RRR, no murmurs, rubs, or gallops.  Respiratory: Clear to auscultation bilaterally. GI: Soft, nontender, non-distended  MS: No edema; No deformity. Neuro:  Nonfocal  Psych: Normal affect   Labs    Chemistry Recent Labs Lab 01/20/17 0813  01/23/17 0400 01/23/17 1634 01/23/17 1648 01/24/17 0410 01/25/17 0310  NA 136  < > 136 136  --  133* 134*  K 4.4  < > 4.6 4.5  --  4.2 4.1  CL 102  < > 107 99*  --  101 103  CO2 27  < > 22  --   --  26 27  GLUCOSE 108*  < > 128* 150*  --  127* 114*  BUN 24*  < > 14 20  --  17 23*  CREATININE 0.91  < > 0.91 1.00 1.10 1.00 1.04  CALCIUM 9.2  < > 7.8*  --   --  8.1* 8.1*  PROT 7.4  --   --   --   --   --   --   ALBUMIN 4.2  --   --   --   --   --   --   AST 29  --   --   --   --   --   --   ALT 22  --   --   --   --   --   --   ALKPHOS 53  --   --   --   --   --   --   BILITOT 1.1  --   --   --   --   --   --   GFRNONAA >60  < > >60  --  >60 >60 >60  GFRAA >60  < > >60  --  >60 >60 >60  ANIONGAP 7  < > 7  --   --  6 4*  < > = values in this interval not displayed.   Hematology Recent Labs Lab 01/23/17 1648 01/24/17 0410 01/25/17 0310  WBC 22.1* 19.6* 17.7*  RBC 3.54* 3.20* 3.31*  HGB 10.4* 9.6* 9.9*  HCT 31.8* 29.0* 29.8*  MCV 89.8 90.6 90.0  MCH 29.4 30.0 29.9  MCHC 32.7 33.1 33.2  RDW 13.3 13.7 13.7  PLT 149* 132* 127*    Cardiac Enzymes Recent Labs Lab 01/21/17 0801 01/21/17 1501 01/21/17 2029 01/22/17 0208  TROPONINI 2.24* 1.82* 1.40* 1.68*    Recent Labs Lab 01/20/17 0819  TROPIPOC 0.26*     BNPNo results for input(s): BNP, PROBNP in the last 168 hours.   DDimer No results for input(s): DDIMER in the last 168 hours.   Radiology    Dg Chest 2 View  Result Date: 01/25/2017 CLINICAL DATA:  S/p  CABG 01/22/17; non-smoker EXAM: CHEST  2 VIEW COMPARISON:  01/24/2017 FINDINGS: Right IJ sheath has been removed. Status post median sternotomy and CABG. Moderate right pneumothorax is unchanged. Left-sided chest tube has been removed. There is minimal bibasilar atelectasis. Heart is enlarged. No overt edema. IMPRESSION: Stable appearance of moderate right pneumothorax. Electronically Signed   By: Nolon Nations M.D.   On: 01/25/2017 08:34    Cardiac Studies     Patient Profile     61 y.o. male  With 3 V CAD,  Now s/p CABG Progressing well   Assessment & Plan    1. CAD:   Good progress after CABG HR is 100 . Will increase metoprolol to 25 BID  Getting his pacing wires out   Home soon    Signed, Mertie Moores, MD  01/26/2017, 11:03 AM

## 2017-01-27 MED ORDER — LACTULOSE 10 GM/15ML PO SOLN: 30.0000 g | Freq: Every day | ORAL | Status: DC | PRN

## 2017-01-27 MED ORDER — METOPROLOL TARTRATE 25 MG PO TABS
25.0000 mg | ORAL_TABLET | Freq: Two times a day (BID) | ORAL | Status: DC
  Administered 2017-01-27 – 2017-01-28 (×3): 25 mg via ORAL
  Filled 2017-01-27 (×3): qty 1

## 2017-01-27 MED ORDER — AMIODARONE HCL IN DEXTROSE 360-4.14 MG/200ML-% IV SOLN
30.0000 mg/h | INTRAVENOUS | Status: AC
  Filled 2017-01-27: qty 200

## 2017-01-27 MED ORDER — AMIODARONE LOAD VIA INFUSION
150.0000 mg | Freq: Once | INTRAVENOUS | Status: AC
  Administered 2017-01-27: 150 mg via INTRAVENOUS
  Filled 2017-01-27: qty 83.34

## 2017-01-27 MED ORDER — AMIODARONE HCL IN DEXTROSE 360-4.14 MG/200ML-% IV SOLN
60.0000 mg/h | INTRAVENOUS | Status: AC
  Administered 2017-01-27 (×2): 60 mg/h via INTRAVENOUS
  Filled 2017-01-27: qty 200

## 2017-01-27 MED ORDER — AMIODARONE HCL 200 MG PO TABS
400.0000 mg | ORAL_TABLET | Freq: Two times a day (BID) | ORAL | Status: DC
Start: 2017-01-27 — End: 2017-01-27
  Filled 2017-01-27: qty 2

## 2017-01-27 MED ORDER — AMIODARONE HCL 200 MG PO TABS
400.0000 mg | ORAL_TABLET | Freq: Two times a day (BID) | ORAL | Status: DC
  Administered 2017-01-27 – 2017-01-28 (×3): 400 mg via ORAL
  Filled 2017-01-27 (×3): qty 2

## 2017-01-27 NOTE — Progress Notes (Signed)
CARDIAC REHAB PHASE I   Spoke with pt, he states he has been walking independently with no complaints, states he has no questions regarding education and declines further ambulation with cardiac rehab, states he will continue to walk independently. Cardiac rehab will sign off at this time.   Lenna Sciara, RN, BSN 01/27/2017 10:45 AM

## 2017-01-27 NOTE — Progress Notes (Addendum)
      [address]       [place] [address]             [phone]      5 Days Post-Op Procedure(s) (LRB): CORONARY ARTERY BYPASS GRAFTING (CABG) times 4 using left internal mammary artery and right greater saphenous leg vein. (N/A) TRANSESOPHAGEAL ECHOCARDIOGRAM (TEE) (N/A)   Subjective:  No new complaints.  Feels good.  He developed Atrial Fibrillation overnight.  + ambulation   + BM  Objective: Vital signs in last 24 hours: Temp:  [97.9 F (36.6 C)-98.7 F (37.1 C)] 97.9 F (36.6 C) (03/06 0618) Pulse Rate:  [88-104] 90 (03/06 0618) Cardiac Rhythm: Normal sinus rhythm (03/06 0700) Resp:  [16-18] 16 (03/06 0618) BP: (96-148)/(56-80) 101/62 (03/06 0618) SpO2:  [93 %-94 %] 94 % (03/06 0618) Weight:  [237 lb 9.6 oz (107.8 kg)] 237 lb 9.6 oz (107.8 kg) (03/06 0618)  Intake/Output from previous day: 03/05 0701 - 03/06 0700 In: 203 [I.V.:203] Out: -   General appearance: alert, cooperative and no distress Heart: regular rate and rhythm Lungs: clear to auscultation bilaterally Abdomen: soft, non-tender; bowel sounds normal; no masses,  no organomegaly Extremities: edema 1+, improving Wound: clean and dry, sutures in place  Lab Results:  Recent Labs  01/25/17 0310  WBC 17.7*  HGB 9.9*  HCT 29.8*  PLT 127*   BMET:  Recent Labs  01/25/17 0310  NA 134*  K 4.1  CL 103  CO2 27  GLUCOSE 114*  BUN 23*  CREATININE 1.04  CALCIUM 8.1*    PT/INR: No results for input(s): LABPROT, INR in the last 72 hours. ABG    Component Value Date/Time   PHART 7.357 01/22/2017 1940   HCO3 18.7 (L) 01/22/2017 1940   TCO2 25 01/23/2017 1634   ACIDBASEDEF 6.0 (H) 01/22/2017 1940   O2SAT 95.0 01/22/2017 1940   CBG (last 3)   Recent Labs  01/25/17 2122 01/26/17 0615 01/26/17 1633  GLUCAP 128* 131* 104*    Assessment/Plan: S/P Procedure(s) (LRB): CORONARY ARTERY BYPASS GRAFTING (CABG) times 4 using left internal mammary artery and right greater  saphenous leg vein. (N/A) TRANSESOPHAGEAL ECHOCARDIOGRAM (TEE) (N/A)  1. CV- A. Fib overnight, currently maintaining NSR- will d/c Amiodarone drip at 12, continue Lopressor, start oral Amiodarone tablets 2. Pulm- no acute issues, continue pulm toilet 3. Renal- creatinine stable, weight is improving... Continue Lasix, potassium 4. GI- constipation resolved 5. Dispo- patient stable, continue low dose lopressor, start oral amiodarone, possibly home in AM if maintains NSR   LOS: 7 days    BARRETT, ERIN 01/27/2017 patient examined and medical record reviewed,agree with above note. Tharon Aquas Trigt III 01/27/2017

## 2017-01-27 NOTE — Progress Notes (Signed)
Pt went into Afib with RVR. Pt converted to sinus rhythm and back and forth. Vital signs stable. EKG in chart. Paged on call MD and new orders given. Will continue to follow.

## 2017-01-28 DIAGNOSIS — I480 Paroxysmal atrial fibrillation: Secondary | ICD-10-CM

## 2017-01-28 LAB — BASIC METABOLIC PANEL
Anion gap: 12 (ref 5–15)
BUN: 23 mg/dL — AB (ref 6–20)
CO2: 27 mmol/L (ref 22–32)
CREATININE: 1.22 mg/dL (ref 0.61–1.24)
Calcium: 8.6 mg/dL — ABNORMAL LOW (ref 8.9–10.3)
Chloride: 91 mmol/L — ABNORMAL LOW (ref 101–111)
GFR calc Af Amer: >60 mL/min (ref >60)
Glucose, Bld: 133 mg/dL — ABNORMAL HIGH (ref 65–99)
Potassium: 3.8 mmol/L (ref 3.5–5.1)
SODIUM: 130 mmol/L — AB (ref 135–145)

## 2017-01-28 LAB — CBC
HCT: 30.6 % — ABNORMAL LOW (ref 39.0–52.0)
Hemoglobin: 10.3 g/dL — ABNORMAL LOW (ref 13.0–17.0)
MCH: 29.4 pg (ref 26.0–34.0)
MCHC: 33.7 g/dL (ref 30.0–36.0)
MCV: 87.4 fL (ref 78.0–100.0)
PLATELETS: 209 10*3/uL (ref 150–400)
RBC: 3.5 MIL/uL — ABNORMAL LOW (ref 4.22–5.81)
RDW: 13.3 % (ref 11.5–15.5)
WBC: 15.3 10*3/uL — ABNORMAL HIGH (ref 4.0–10.5)

## 2017-01-28 MED ORDER — AMIODARONE HCL 200 MG PO TABS: 400.0000 mg | ORAL_TABLET | Freq: Two times a day (BID) | ORAL | 1 refills | Status: DC

## 2017-01-28 MED ORDER — METOPROLOL TARTRATE 25 MG PO TABS: 25.0000 mg | ORAL_TABLET | Freq: Two times a day (BID) | ORAL | 3 refills | Status: DC

## 2017-01-28 NOTE — Progress Notes (Signed)
Progress Note  Patient Name: [name] Date of Encounter: 01/28/2017  Primary Cardiologist: Irish Lack  Subjective   61 yo admitted with CP Cath revealed significant 3 V cad Has had CABG. Progressing well  Had some atrial fib. Is back in sinus tachycardia now Is on amio and metoprolol    Inpatient Medications    Scheduled Meds: . amiodarone  400 mg Oral BID  . aspirin EC  325 mg Oral Daily   Or  . aspirin  324 mg Per Tube Daily  . atorvastatin  40 mg Oral q1800  . bisacodyl  10 mg Oral Daily   Or  . bisacodyl  10 mg Rectal Daily  . chlorpheniramine-HYDROcodone  5 mL Oral Q12H  . docusate sodium  200 mg Oral Daily  . furosemide  40 mg Oral Daily  . guaiFENesin  600 mg Oral BID  . levofloxacin  500 mg Oral Daily  . metoprolol tartrate  25 mg Oral BID  . mometasone-formoterol  2 puff Inhalation BID  . pantoprazole  40 mg Oral Daily  . potassium chloride  20 mEq Oral Daily  . sodium chloride flush  3 mL Intravenous Q12H   Continuous Infusions: . sodium chloride Stopped (01/23/17 1200)  . sodium chloride    . sodium chloride Stopped (01/23/17 1200)   PRN Meds: sodium chloride, sodium chloride, lactulose, metoprolol, ondansetron (ZOFRAN) IV, oxyCODONE, sodium chloride flush, traMADol, zolpidem   Vital Signs    Vitals:   01/27/17 2137 01/28/17 0429 01/28/17 0929 01/28/17 1043  BP:  (!) 104/50  111/67  Pulse:  84  92  Resp:  18    Temp:  98.2 F (36.8 C)    TempSrc:  Oral    SpO2: 94% 94% 91%   Weight:  235 lb 7.2 oz (106.8 kg)    Height:        Intake/Output Summary (Last 24 hours) at 01/28/17 1056 Last data filed at 01/27/17 1339  Gross per 24 hour  Intake              240 ml  Output                0 ml  Net              240 ml   Filed Weights   01/26/17 0645 01/27/17 0618 01/28/17 0429  Weight: 241 lb 6.4 oz (109.5 kg) 237 lb 9.6 oz (107.8 kg) 235 lb 7.2 oz (106.8 kg)    Telemetry    Sinus tach  - Personally Reviewed  ECG    Sinus  tach  - Personally Reviewed  Physical Exam   GEN: No acute distress.   Neck: No JVD Cardiac: RRR, tachy  no murmurs, rubs, or gallops.  Respiratory: Clear to auscultation bilaterally. GI: Soft, nontender, non-distended  MS: No edema; No deformity. Neuro:  Nonfocal  Psych: Normal affect   Labs    Chemistry  Recent Labs Lab 01/24/17 0410 01/25/17 0310 01/28/17 0237  NA 133* 134* 130*  K 4.2 4.1 3.8  CL 101 103 91*  CO2 26 27 27   GLUCOSE 127* 114* 133*  BUN 17 23* 23*  CREATININE 1.00 1.04 1.22  CALCIUM 8.1* 8.1* 8.6*  GFRNONAA >60 >60 >60  GFRAA >60 >60 >60  ANIONGAP 6 4* 12     Hematology  Recent Labs Lab 01/24/17 0410 01/25/17 0310 01/28/17 0237  WBC 19.6* 17.7* 15.3*  RBC 3.20* 3.31* 3.50*  HGB 9.6* 9.9* 10.3*  HCT 29.0* 29.8* 30.6*  MCV 90.6 90.0 87.4  MCH 30.0 29.9 29.4  MCHC 33.1 33.2 33.7  RDW 13.7 13.7 13.3  PLT 132* 127* 209    Cardiac Enzymes  Recent Labs Lab 01/21/17 1501 01/21/17 2029 01/22/17 0208  TROPONINI 1.82* 1.40* 1.68*   No results for input(s): TROPIPOC in the last 168 hours.   BNPNo results for input(s): BNP, PROBNP in the last 168 hours.   DDimer No results for input(s): DDIMER in the last 168 hours.   Radiology    No results found.  Cardiac Studies     Patient Profile     61 y.o. male  With 3 V CAD,  Now s/p CABG Progressing well   Assessment & Plan    1. CAD:   Good progress after CABG HR is 100 .   2. Atrial fib:   Clinically he is back in sinus rhythm, sinus tach. On amio and metoprolol  Will taper the amio down when he see [place] in the offic.e   I suspect he will be able to come off the amio after several months     Signed, Mertie Moores, MD  01/28/2017, 10:56 AM

## 2017-01-28 NOTE — Care Management Note (Signed)
Case Management Note  Patient Details  Name: [name] MRN: [id number] Date of Birth: 03-Jul-1956  Subjective/Objective:               Patient Walking independently. CABG X4, had a fib 3/5     Action/Plan:  No DC needs.  Expected Discharge Date:  01/28/17               Expected Discharge Plan:  Home/Self Care  In-House Referral:     Discharge planning Services  CM Consult  Post Acute Care Choice:    Choice offered to:     DME Arranged:    DME Agency:     HH Arranged:    HH Agency:     Status of Service:  Completed, signed off  If discussed at [employer] of Stay Meetings, dates discussed:    Additional Comments:  Carles Collet, RN 01/28/2017, 9:40 AM

## 2017-01-28 NOTE — Progress Notes (Signed)
Pt. Discharged to hme with wife Pt. D/C'd via wheelchair with RN Discharge information reviewed and given with Rx All personal belongings given to Pt.  Education discussed with teach back  IV was d/c Tele d/c

## 2017-01-29 ENCOUNTER — Telehealth (HOSPITAL_COMMUNITY): Payer: Self-pay | Admitting: General Practice

## 2017-01-29 NOTE — Telephone Encounter (Signed)
Verified BCBS insurance benefits through [employer]. No Co-Pay, Co-Insurance 20%, No Deductible Out of Pocket $7350.00, pt has met $1900.74, pt's balance is $5449.26 Reference [phone].... KJ

## 2017-02-02 ENCOUNTER — Ambulatory Visit: Payer: BLUE CROSS/BLUE SHIELD | Admitting: Family Medicine

## 2017-02-05 ENCOUNTER — Ambulatory Visit (INDEPENDENT_AMBULATORY_CARE_PROVIDER_SITE_OTHER): Payer: Self-pay

## 2017-02-05 DIAGNOSIS — Z951 Presence of aortocoronary bypass graft: Secondary | ICD-10-CM

## 2017-02-05 DIAGNOSIS — Z4802 Encounter for removal of sutures: Secondary | ICD-10-CM

## 2017-02-05 NOTE — Progress Notes (Signed)
Removed  20 sutures from inner right leg saphenous vein harvest site with the help of Dr Servando Snare. No signs of infection and patient tolerated well.

## 2017-02-10 ENCOUNTER — Ambulatory Visit (INDEPENDENT_AMBULATORY_CARE_PROVIDER_SITE_OTHER): Payer: BLUE CROSS/BLUE SHIELD | Admitting: Cardiology

## 2017-02-10 ENCOUNTER — Encounter: Payer: Self-pay | Admitting: Cardiology

## 2017-02-10 VITALS — BP 96/58 | HR 88 | Ht 70.0 in | Wt 233.4 lb

## 2017-02-10 DIAGNOSIS — E782 Mixed hyperlipidemia: Secondary | ICD-10-CM

## 2017-02-10 DIAGNOSIS — I214 Non-ST elevation (NSTEMI) myocardial infarction: Secondary | ICD-10-CM | POA: Diagnosis not present

## 2017-02-10 DIAGNOSIS — Z951 Presence of aortocoronary bypass graft: Secondary | ICD-10-CM

## 2017-02-10 DIAGNOSIS — I255 Ischemic cardiomyopathy: Secondary | ICD-10-CM

## 2017-02-10 DIAGNOSIS — E785 Hyperlipidemia, unspecified: Secondary | ICD-10-CM

## 2017-02-10 DIAGNOSIS — I480 Paroxysmal atrial fibrillation: Secondary | ICD-10-CM

## 2017-02-10 DIAGNOSIS — I2510 Atherosclerotic heart disease of native coronary artery without angina pectoris: Secondary | ICD-10-CM

## 2017-02-10 HISTORY — DX: Paroxysmal atrial fibrillation: I48.0

## 2017-02-10 MED ORDER — CLOPIDOGREL BISULFATE 75 MG PO TABS: 75.0000 mg | ORAL_TABLET | Freq: Every day | ORAL | 3 refills | Status: DC

## 2017-02-10 MED ORDER — ASPIRIN EC 81 MG PO TBEC: 81.0000 mg | DELAYED_RELEASE_TABLET | Freq: Every day | ORAL | 3 refills | Status: AC

## 2017-02-10 NOTE — Progress Notes (Signed)
Cardiology Office Note    Date:  02/10/2017   ID:  [name], DOB [birth year], MRN [id number]  PCP:  No PCP Per Patient  Cardiologist: Dr Burt Knack, today seen by Ena Dawley, MD   Chief complain: Posthospitalization follow up, s/p CABG  History of Present Illness:  [name] is a 61 y.o. male with positive family history of CAD.  He presented to the ED on 01/20/2017 with complaints of chest pain that developed at rest.  EKG showed non-specific ST changes in the inferior lead.  Cardiac enzymes were mildly elevated.  The patient was admitted for further care. He underwent cardiac catheterization on day of admission and was found to have multivessel CAD with a preserved EF.  It was felt coronary bypass grafting would be indicated and TCTS consult was obtained.  He was evaluated by Dr. Prescott Gum who was in agreement with proceeding with coronary bypass grafting procedure.  The risks and benefits of the procedure were explained to the patient and he was agreeable to proceed.  The patient remained chest pain free prior to proceeding with the procedure.  He was taken to the operating room on 01/22/2017.  He underwent CABG x 4 utilizing LIMA to LAD, SVG to PDA, SVG to Ramus, and SVG to diagonal.  He also underwent endoscopic harvest of greater saphenous vein, however this was converted to an open procedure due to lack of visualization due to bleeding.  He tolerated the procedure without difficulty and was taken to the SICU in stable condition.  He was extubate the evening of surgery.  During his stay in the SICU the patient was weaned off Milrinone as tolerated.  Follow up CXR showed development of a 15% pneumothorax on the right.  This resolved on subsequent CXRs.  The patient's chest tubes and arterial lines were removed without difficulty.  His JP drain was removed from his RLE once output was reduced.  He developed productive cough and was started on empiric Levaquin due to mild leukocytosis.   He was maintaining NSR and was felt medically stable for transfer to the step down unit on POD #2.  The patient continues to make progress.  His pacing wires have been removed without difficulty.  However he developed Atrial Fibrillation overnight.  He converted with use of IV Amiodarone.  He continues to maintain NSR.   He is ambulating independently.  He is tolerating a heart healthy diet.  His pain is well controlled.  He is medically stable for discharge home today.      02/10/2017 - today he states that he has been feeling great, he feels some tingling around the sternotomy site, but no erythema or discharge. He denies chest pain or SOB. He has been walking on treadmill for up to 30 minutes without symptoms. He hasn't started cardiac rehabilitation yet. Denies muscle pain from newly started atorvastatin. Denies palpitations or syncope.   Past Medical History:  Diagnosis Date  . Adenomatous colon polyp   . Gastric erosions   . Gastric polyps   . Gastric ulcer   . GERD (gastroesophageal reflux disease)   . GI bleed due to NSAIDs   . HLD (hyperlipidemia)     Past Surgical History:  Procedure Laterality Date  . CORONARY ARTERY BYPASS GRAFT N/A 01/22/2017   Procedure: CORONARY ARTERY BYPASS GRAFTING (CABG) times 4 using left internal mammary artery and right greater saphenous leg vein.;  Surgeon: Ivin Poot, MD;  Location: [facility];  Service: Open Heart Surgery;  Laterality: N/A;  . ESOPHAGOGASTRODUODENOSCOPY N/A 08/24/2014   Procedure: ESOPHAGOGASTRODUODENOSCOPY (EGD);  Surgeon: Jerene Bears, MD;  Location: [facility];  Service: Endoscopy;  Laterality: N/A;  . LEFT HEART CATH AND CORONARY ANGIOGRAPHY N/A 01/20/2017   Procedure: Left Heart Cath and Coronary Angiography;  Surgeon: Sherren Mocha, MD;  Location: [facility];  Service: Cardiovascular;  Laterality: N/A;  . TEE WITHOUT CARDIOVERSION N/A 01/22/2017   Procedure: TRANSESOPHAGEAL ECHOCARDIOGRAM (TEE);  Surgeon: Ivin Poot,  MD;  Location: [facility];  Service: Open Heart Surgery;  Laterality: N/A;    Current Medications: Outpatient Medications Prior to Visit  Medication Sig Dispense Refill  . acetaminophen (TYLENOL) 500 MG tablet Take 2 tablets (1,000 mg total) by mouth every 6 (six) hours as needed for mild pain or fever. 30 tablet 0  . atorvastatin (LIPITOR) 40 MG tablet Take 1 tablet (40 mg total) by mouth daily at 6 PM. 30 tablet 3  . metoprolol tartrate (LOPRESSOR) 25 MG tablet Take 1 tablet (25 mg total) by mouth 2 (two) times daily. 60 tablet 3  . amiodarone (PACERONE) 200 MG tablet Take 2 tablets (400 mg total) by mouth 2 (two) times daily. For 7 days, then decrease to 200 mg BID 60 tablet 1  . aspirin EC 325 MG EC tablet Take 1 tablet (325 mg total) by mouth daily. 30 tablet 0  . levofloxacin (LEVAQUIN) 500 MG tablet Take 1 tablet (500 mg total) by mouth daily. (Patient not taking: Reported on 02/10/2017) 5 tablet 0  . OVER THE COUNTER MEDICATION Take 3 tablets by mouth daily. Nitrulline: Nitric Oxide Potentiator supplement.     No facility-administered medications prior to visit.      Allergies:   Ibuprofen   Social History   Social History  . Marital status: Married    Spouse name: N/A  . Number of children: N/A  . Years of education: N/A   Social History Main Topics  . Smoking status: Never Smoker  . Smokeless tobacco: Never Used  . Alcohol use No  . Drug use: No  . Sexual activity: Yes   Other Topics Concern  . None   Social History Narrative  . None     Family History:  The patient's family history includes Diabetes in his father; Esophageal cancer in his paternal grandmother; Heart disease in his brother, father, and sister; Liver cancer in his maternal grandmother.   ROS:   Please see the history of present illness.    ROS All other systems reviewed and are negative.   PHYSICAL EXAM:   VS:  BP (!) 96/58   Pulse 88   Ht 5\' 10"  (1.778 m)   Wt 233 lb 6.4 oz (105.9 kg)   SpO2  98%   BMI 33.49 kg/m    GEN: Well nourished, well developed, in no acute distress  HEENT: normal  Neck: no JVD, carotid bruits, or masses Cardiac:RRR; no murmurs, rubs, or gallops,no edema , sternotomy scar well healed, no erythema or discharge Respiratory:  clear to auscultation bilaterally, normal work of breathing GI: soft, nontender, nondistended, + BS MS: no deformity or atrophy  Skin: warm and dry, no rash Neuro:  Alert and Oriented x 3, Strength and sensation are intact Psych: euthymic mood, full affect  Wt Readings from Last 3 Encounters:  02/10/17 233 lb 6.4 oz (105.9 kg)  01/28/17 235 lb 7.2 oz (106.8 kg)  10/26/15 232 lb 12.8 oz (105.6 kg)     Studies/Labs Reviewed:  EKG:  EKG is ordered today.  The ekg ordered today was personally reviewed and showed SR, negative T waves in the lateral leads, unchanged from prior.  Recent Labs: 01/20/2017: ALT 22 01/21/2017: TSH 1.098 01/23/2017: Magnesium 2.5 01/28/2017: BUN 23; Creatinine, Ser 1.22; Hemoglobin 10.3; Platelets 209; Potassium 3.8; Sodium 130   Lipid Panel    Component Value Date/Time   CHOL 184 01/21/2017 0206   TRIG 64 01/21/2017 0206   HDL 43 01/21/2017 0206   CHOLHDL 4.3 01/21/2017 0206   VLDL 13 01/21/2017 0206   LDLCALC 128 (H) 01/21/2017 0206   LDLDIRECT 161.3 10/03/2008 0926    Additional studies/ records that were reviewed today include:   LHC: 01/20/2017 1. Severe multivessel coronary artery disease with critical stenosis of the distal RCA and mid LAD, severe stenosis of the ramus intermedius and diagonal branches, and moderate stenosis of the distal circumflex 2. Moderate segmental LV systolic dysfunction with akinesis of the basal and midinferior walls  Recommendation: The patient is chest pain-free. The RCA appears to be his culprit vessel with critical stenosis and a corresponding wall motion abnormality on ventriculography. With severe multivessel CAD and LV dysfunction, I think he would receive  mortality benefit and better long-term symptom relief from multivessel CABG. A cardiac surgery consultation will be placed.   TEE: 01/22/2017  Left ventricle: Normal wall thickness. Cavity is mildly dilated. LV systolic function is mildly reduced with an EF of 45-50%. Grade II (moderate, pseudorelaxation) left ventricular diastolic dysfunction. Wall motion is abnormal. Apical, inferoseptal wall motion is hypokinetic.  Aortic valve: The valve is trileaflet. No stenosis. No regurgitation.  Mitral valve: No leaflet thickening and calcification present. Trace regurgitation.  Right ventricle: Normal cavity size, wall thickness and ejection fraction. Normal tricuspid annular plane systolic excursion (TAPSE).  Right atrium: Interatrial septum bows to the left, consistent with elevated right atrial pressure.  Tricuspid valve: Trace regurgitation.     ASSESSMENT:    1. NSTEMI (non-ST elevated myocardial infarction) ([place])   2. S/P CABG x 4   3. PAF (paroxysmal atrial fibrillation) ([place])   4. Coronary artery disease involving native heart without angina pectoris, unspecified vessel or lesion type   5. Ischemic cardiomyopathy   6. Hyperlipidemia, unspecified hyperlipidemia type   7. Mixed hyperlipidemia      PLAN:  In order of problems listed above:  1. CAD, s/p NSTEMI, s/p CABG as above on 01/22/2017 - recovering well - continue lopressor 25 mg po BID - decrease ASA to 81 mg po daily, add Plavix 75 mg po daily and he had a NSTEMI  2. Ischemic cardiomyopathy - LVEF 45-50% on periop TEE - repeat TTE now  3. PAF - in the post op period - in SR today, discontinue amiodarone - continue lopressor  4. Hyperlipidemia - started on atorvastatin 80 mg po daily - check CMP and lipids now   Medication Adjustments/Labs and Tests Ordered: Current medicines are reviewed at length with the patient today.  Concerns regarding medicines are outlined above.  Medication changes, Labs and Tests ordered  today are listed in the Patient Instructions below. Patient Instructions  Medication Instructions:   STOP TAKING AMIODARONE NOW  STOP TAKING ASPIRIN 325 MG NOW  START TAKING ASPIRIN 81 MG ONCE DAILY   START TAKING PLAVIX 75 MG ONCE DAILY     Labwork:  IN THE NEAR FUTURE--SAME DAY AS YOUR ECHO--TO CHECK A CMET, CBC W DIFF, TSH, AND LIPIDS---PLEASE COME FASTING TO THIS LAB APPOINTMENT  Testing/Procedures:  Your physician has requested that you have an echocardiogram. Echocardiography is a painless test that uses sound waves to create images of your heart. It provides your doctor with information about the size and shape of your heart and how well your heart's chambers and valves are working. This procedure takes approximately one hour. There are no restrictions for this procedure.  PLEASE HAVE YOUR LAB APPOINTMENT SCHEDULED THE SAME DAY AS YOUR ECHO FOR CONVENIENCE.     Follow-Up:  Your physician wants you to follow-up in: [place] will receive a reminder letter in the mail two months in advance. If you don't receive a letter, please call our office to schedule the follow-up appointment.        If you need a refill on your cardiac medications before your next appointment, please call your pharmacy.      Signed, Ena Dawley, MD  02/10/2017 10:10 PM    [facility] [address], [place], [place]  [address] Phone: [phone]; Fax: [phone]

## 2017-02-10 NOTE — Patient Instructions (Signed)
Medication Instructions:   STOP TAKING AMIODARONE NOW  STOP TAKING ASPIRIN 325 MG NOW  START TAKING ASPIRIN 81 MG ONCE DAILY   START TAKING PLAVIX 75 MG ONCE DAILY     Labwork:  IN THE NEAR FUTURE--SAME DAY AS YOUR ECHO--TO CHECK A CMET, CBC W DIFF, TSH, AND LIPIDS---PLEASE COME FASTING TO THIS LAB APPOINTMENT     Testing/Procedures:  Your physician has requested that you have an echocardiogram. Echocardiography is a painless test that uses sound waves to create images of your heart. It provides your doctor with information about the size and shape of your heart and how well your heart's chambers and valves are working. This procedure takes approximately one hour. There are no restrictions for this procedure.  PLEASE HAVE YOUR LAB APPOINTMENT SCHEDULED THE SAME DAY AS YOUR ECHO FOR CONVENIENCE.     Follow-Up:  Your physician wants you to follow-up in: [place] will receive a reminder letter in the mail two months in advance. If you don't receive a letter, please call our office to schedule the follow-up appointment.        If you need a refill on your cardiac medications before your next appointment, please call your pharmacy.

## 2017-02-12 ENCOUNTER — Encounter: Payer: BLUE CROSS/BLUE SHIELD | Admitting: Physician Assistant

## 2017-02-13 ENCOUNTER — Other Ambulatory Visit: Payer: Self-pay | Admitting: Cardiothoracic Surgery

## 2017-02-13 ENCOUNTER — Telehealth (HOSPITAL_COMMUNITY): Payer: Self-pay | Admitting: General Practice

## 2017-02-13 DIAGNOSIS — Z951 Presence of aortocoronary bypass graft: Secondary | ICD-10-CM

## 2017-02-13 NOTE — Telephone Encounter (Signed)
S/w pt has an apt with Surgeon on 04/02, advised pt we do not have anything for CRP before he heads out of town. Pt will be gone 5 months and advised to have his Cardiologist to send a referral to the location that he will be heading due to work location. Advised pt we would put him on the cancellation list. KJ

## 2017-02-23 ENCOUNTER — Ambulatory Visit (INDEPENDENT_AMBULATORY_CARE_PROVIDER_SITE_OTHER): Payer: Self-pay | Admitting: Physician Assistant

## 2017-02-23 ENCOUNTER — Ambulatory Visit
Admission: RE | Admit: 2017-02-23 | Discharge: 2017-02-23 | Disposition: A | Discharge status: Home / Self Care | Payer: BLUE CROSS/BLUE SHIELD | Source: Ambulatory Visit | Attending: Cardiothoracic Surgery | Admitting: Cardiothoracic Surgery

## 2017-02-23 VITALS — BP 110/80 | HR 88 | Resp 20 | Ht 70.0 in | Wt 233.0 lb

## 2017-02-23 DIAGNOSIS — Z951 Presence of aortocoronary bypass graft: Secondary | ICD-10-CM

## 2017-02-23 NOTE — Patient Instructions (Signed)
You may return to driving an automobile as long as you are no longer requiring oral narcotic pain relievers during the daytime.  It would be wise to start driving only short distances during the daylight and gradually increase from there as you feel comfortable.  Make every effort to maintain a "heart-healthy" lifestyle with regular physical exercise and adherence to a low-fat, low-carbohydrate diet.  Continue to seek regular follow-up appointments with your primary care physician and/or cardiologist.  You are encouraged to enroll and participate in the outpatient cardiac rehab program beginning as soon as practical.

## 2017-02-23 NOTE — Progress Notes (Signed)
HPI:  Patient returns for routine postoperative follow-up having undergone CABG x 4 on 01/22/2017. The patient's early postoperative recovery while in the hospital was notable for post operative Atrial Fibrillation.  Since hospital discharge the patient reports he is doing very well.  He states he wishes he was able to do more physically, but cardiac rehab is unable to see him prior to May.  He states he does not know what a safe heart rate would be for exercise.  He incisions are healing well without evidence of infection.  He also states that his leg has been softening up since hospital discharge.  He does ask if he can put lotion on his incisions.  He has lost 20 lbs following a heart healthy diet and he plans to continue this and lose 30 more pounds. He does question need for Plavix that was prescribed at Cardiology office.   Current Outpatient Prescriptions  Medication Sig Dispense Refill  . aspirin EC 81 MG tablet Take 1 tablet (81 mg total) by mouth daily. 90 tablet 3  . atorvastatin (LIPITOR) 40 MG tablet Take 1 tablet (40 mg total) by mouth daily at 6 PM. 30 tablet 3  . metoprolol tartrate (LOPRESSOR) 25 MG tablet Take 1 tablet (25 mg total) by mouth 2 (two) times daily. 60 tablet 3  . acetaminophen (TYLENOL) 500 MG tablet Take 2 tablets (1,000 mg total) by mouth every 6 (six) hours as needed for mild pain or fever. (Patient not taking: Reported on 02/23/2017) 30 tablet 0  . clopidogrel (PLAVIX) 75 MG tablet Take 1 tablet (75 mg total) by mouth daily. (Patient not taking: Reported on 02/23/2017) 90 tablet 3   No current facility-administered medications for this visit.     Physical Exam:  BP 110/80   Pulse 88   Resp 20   Ht 5\' 10"  (1.778 m)   Wt 233 lb (105.7 kg)   SpO2 97% Comment: RA  BMI 33.43 kg/m   Gen: no apparent distress Heart: RRR Lungs: CTA bilaterally Incisions: well healed, no evidence of infection Ext: no edema  Diagnostic Tests:  CXR: resolution of pneumothorax and  pleural effusions  A/P:  1. S/p CABG- doing very well... Will continue Lopressor at current regimen... Encouraged patient to start Plavix and reduce ASA to 81 mg daily... Maintaining NSR 2. Incisions- well healed no evidence of infection 3. Activity as tolerated- cardiac rehab has been set up for May... Patient encouraged to continued his activity level and increase slowly as tolerated 4. Dispo- patient looks great, doing very well, RTC prn  Alison Kubicki, PA-C [facility] [phone]

## 2017-02-24 ENCOUNTER — Telehealth: Payer: Self-pay | Admitting: Cardiology

## 2017-02-24 MED ORDER — SILDENAFIL CITRATE 25 MG PO TABS: 25.0000 mg | ORAL_TABLET | Freq: Every day | ORAL | 5 refills | Status: DC | PRN

## 2017-02-24 NOTE — Telephone Encounter (Signed)
Notified the pt that per Dr Meda Coffee, she recommends that he can have Viagra 25 mg po daily PRN for erectile dysfunction with 10 pills and 5 refills.  Advised the pt that per Dr Meda Coffee, he should be aware that he cannot take NTG while utilizing this med.  Confirmed the pharmacy of choice with the pt.  Pt verbalized understanding and agrees with this plan.  Pt gracious for all the assistance provided.

## 2017-02-24 NOTE — Telephone Encounter (Signed)
I would go with Viagra 25 mg PO PRN daily 10 pills with 5 refills. He should be aware that he cant take it with any NTG.

## 2017-02-24 NOTE — Telephone Encounter (Signed)
Patient calling, has a few questions to ask. Would not go into details, thanks.

## 2017-02-24 NOTE — Telephone Encounter (Signed)
Pt is calling to inform Dr Meda Coffee that Dr. Prescott Gum cleared him to travel for work to [place].   Pt also states that he spoke with Dr. Prescott Gum about erectile dysfunction with the use of beta blockers.  Pt asked Dr. Prescott Gum for a medication to assist with this, and he advised the pt to call Dr Meda Coffee for an order for Viagra or Cialis.   Pt is calling to ask Dr Meda Coffee if she would order this for him?  Informed the pt that Dr Meda Coffee is out of the office today, but I will route this message to her for further review and recommendation, and follow-up with the pt shortly thereafter.   Pt verbalized understanding and agrees with this plan.

## 2017-02-25 ENCOUNTER — Other Ambulatory Visit: Payer: BLUE CROSS/BLUE SHIELD | Admitting: *Deleted

## 2017-02-25 ENCOUNTER — Other Ambulatory Visit: Payer: Self-pay

## 2017-02-25 ENCOUNTER — Ambulatory Visit: Payer: BLUE CROSS/BLUE SHIELD | Admitting: Cardiothoracic Surgery

## 2017-02-25 ENCOUNTER — Telehealth: Payer: Self-pay | Admitting: *Deleted

## 2017-02-25 ENCOUNTER — Ambulatory Visit (HOSPITAL_COMMUNITY): Payer: BLUE CROSS/BLUE SHIELD | Attending: Internal Medicine

## 2017-02-25 ENCOUNTER — Other Ambulatory Visit (HOSPITAL_COMMUNITY): Payer: BLUE CROSS/BLUE SHIELD

## 2017-02-25 DIAGNOSIS — I480 Paroxysmal atrial fibrillation: Secondary | ICD-10-CM

## 2017-02-25 DIAGNOSIS — Z951 Presence of aortocoronary bypass graft: Secondary | ICD-10-CM | POA: Diagnosis present

## 2017-02-25 DIAGNOSIS — I2510 Atherosclerotic heart disease of native coronary artery without angina pectoris: Secondary | ICD-10-CM | POA: Insufficient documentation

## 2017-02-25 DIAGNOSIS — I252 Old myocardial infarction: Secondary | ICD-10-CM | POA: Diagnosis not present

## 2017-02-25 DIAGNOSIS — E785 Hyperlipidemia, unspecified: Secondary | ICD-10-CM

## 2017-02-25 DIAGNOSIS — Z8249 Family history of ischemic heart disease and other diseases of the circulatory system: Secondary | ICD-10-CM | POA: Diagnosis not present

## 2017-02-25 LAB — COMPREHENSIVE METABOLIC PANEL
ALT: 18 IU/L (ref 0–44)
AST: 14 IU/L (ref 0–40)
Albumin/Globulin Ratio: 1.5 (ref 1.2–2.2)
Albumin: 4.1 g/dL (ref 3.5–5.5)
Alkaline Phosphatase: 96 IU/L (ref 39–117)
BUN/Creatinine Ratio: 18 (ref 9–20)
BUN: 19 mg/dL (ref 6–24)
Bilirubin Total: 1 mg/dL (ref 0.0–1.2)
CO2: 23 mmol/L (ref 18–29)
Calcium: 9.4 mg/dL (ref 8.7–10.2)
Chloride: 100 mmol/L (ref 96–106)
Creatinine, Ser: 1.07 mg/dL (ref 0.76–1.27)
GFR calc Af Amer: 87 mL/min/{1.73_m2} (ref >59)
GFR calc non Af Amer: 76 mL/min/{1.73_m2} (ref >59)
Globulin, Total: 2.8 g/dL (ref 1.5–4.5)
Glucose: 112 mg/dL — ABNORMAL HIGH (ref 65–99)
Potassium: 5.2 mmol/L (ref 3.5–5.2)
Sodium: 139 mmol/L (ref 134–144)
Total Protein: 6.9 g/dL (ref 6.0–8.5)

## 2017-02-25 LAB — CBC WITH DIFFERENTIAL/PLATELET
Basophils Absolute: 0.1 10*3/uL (ref 0.0–0.2)
Basos: 1 %
EOS (ABSOLUTE): 0.6 10*3/uL — ABNORMAL HIGH (ref 0.0–0.4)
Eos: 8 %
Hematocrit: 40.2 % (ref 37.5–51.0)
Hemoglobin: 13.1 g/dL (ref 13.0–17.7)
Immature Grans (Abs): 0 10*3/uL (ref 0.0–0.1)
Immature Granulocytes: 0 %
Lymphocytes Absolute: 1.8 10*3/uL (ref 0.7–3.1)
Lymphs: 24 %
MCH: 28.9 pg (ref 26.6–33.0)
MCHC: 32.6 g/dL (ref 31.5–35.7)
MCV: 89 fL (ref 79–97)
Monocytes Absolute: 0.8 10*3/uL (ref 0.1–0.9)
Monocytes: 11 %
Neutrophils Absolute: 4.2 10*3/uL (ref 1.4–7.0)
Neutrophils: 56 %
Platelets: 199 10*3/uL (ref 150–379)
RBC: 4.54 x10E6/uL (ref 4.14–5.80)
RDW: 14.1 % (ref 12.3–15.4)
WBC: 7.5 10*3/uL (ref 3.4–10.8)

## 2017-02-25 LAB — TSH: TSH: 1.78 u[IU]/mL (ref 0.450–4.500)

## 2017-02-25 LAB — LIPID PANEL
Chol/HDL Ratio: 3.4 ratio (ref 0.0–5.0)
Cholesterol, Total: 142 mg/dL (ref 100–199)
HDL: 42 mg/dL
LDL Calculated: 90 mg/dL (ref 0–99)
Triglycerides: 49 mg/dL (ref 0–149)
VLDL Cholesterol Cal: 10 mg/dL (ref 5–40)

## 2017-02-25 MED ORDER — PERFLUTREN LIPID MICROSPHERE
1.0000 mL | INTRAVENOUS | Status: AC | PRN
  Administered 2017-02-25: 3 mL via INTRAVENOUS

## 2017-02-25 NOTE — Telephone Encounter (Signed)
Please switch to cialis 2.5 mg po PRN Q72H 10 pills, 5 refills

## 2017-02-25 NOTE — Telephone Encounter (Signed)
Patient was told by pharmacy insurance will not cover his Sildenafil.  He called his insurance and they said they cover Cialis 2.5 and 5 mg.  Would you like to change patient's rx and if so what mg? It will still need a PA when change is approved.   Insurance Phone# for [employer]

## 2017-02-25 NOTE — Addendum Note (Signed)
Addended by: Eulis Foster on: 02/25/2017 07:28 AM   Modules accepted: Orders

## 2017-02-25 NOTE — Telephone Encounter (Signed)
Will forward to refill and prior authorization team.

## 2017-02-26 MED ORDER — TADALAFIL 2.5 MG PO TABS: 2.5000 mg | ORAL_TABLET | ORAL | 5 refills | Status: DC | PRN

## 2017-02-26 NOTE — Addendum Note (Signed)
Addended by: Dennie Fetters on: 02/26/2017 08:21 AM   Modules accepted: Orders

## 2017-02-26 NOTE — Telephone Encounter (Signed)
I could not do PA through covermymeds as Cialis requires PA to be done over the telephone. I did PA over the phone with [place] at [facility] . She states that will receive a decision within a couple of days.

## 2017-02-26 NOTE — Telephone Encounter (Signed)
**Note De-Identified Kerrilynn Derenzo Obfuscation** I have sent new RX for Cialis to [facility] and the pt is aware. He states that his insurance will need a PA for Cialis. He is advised that we will do PA when form is received from [facility]. He verbalized understanding.

## 2017-03-03 ENCOUNTER — Telehealth: Payer: Self-pay | Admitting: *Deleted

## 2017-03-03 NOTE — Telephone Encounter (Signed)
Sent an appeal for patients CIALIS that was denied earlier. I have spoke with patient and knows appeal sent. Please contact patient with appeal decision

## 2017-03-06 NOTE — Telephone Encounter (Signed)
**Note De-Identified [name] Obfuscation** Received a fax today from [facility] stating that they have denied the pts appeal for Cialis as they determined this medication is not medically necessary.  LMTCB.

## 2017-03-10 NOTE — Telephone Encounter (Signed)
**Note De-Identified [name] Obfuscation** The pt is aware that his insurance will not cover Cialis. He states that he will check online for any Cialis discounts and that he will pay cash for cialis.

## 2017-03-23 ENCOUNTER — Other Ambulatory Visit: Payer: Self-pay | Admitting: Cardiology

## 2017-03-23 MED ORDER — ATORVASTATIN CALCIUM 40 MG PO TABS: 40.0000 mg | ORAL_TABLET | Freq: Every day | ORAL | 3 refills | Status: DC

## 2017-03-23 MED ORDER — METOPROLOL TARTRATE 25 MG PO TABS: 25.0000 mg | ORAL_TABLET | Freq: Two times a day (BID) | ORAL | 3 refills | Status: DC

## 2017-03-25 ENCOUNTER — Telehealth: Payer: Self-pay | Admitting: Cardiovascular Disease

## 2017-03-25 NOTE — Telephone Encounter (Signed)
error 

## 2017-04-24 NOTE — Addendum Note (Signed)
Addendum  created 04/24/17 1218 by Rica Koyanagi, MD   Sign clinical note

## 2017-08-06 ENCOUNTER — Telehealth: Payer: Self-pay | Admitting: General Practice

## 2017-08-06 NOTE — Telephone Encounter (Signed)
Pt would like to know if you would be willing to take him on as a new pt?    Best number [phone]

## 2017-08-06 NOTE — Telephone Encounter (Signed)
Ok with me, thanks.

## 2017-08-07 NOTE — Telephone Encounter (Signed)
Called pt left to call back to make his transfer appt

## 2017-08-14 ENCOUNTER — Ambulatory Visit (INDEPENDENT_AMBULATORY_CARE_PROVIDER_SITE_OTHER): Payer: BLUE CROSS/BLUE SHIELD | Admitting: Internal Medicine

## 2017-08-14 ENCOUNTER — Encounter: Payer: Self-pay | Admitting: Internal Medicine

## 2017-08-14 VITALS — BP 120/82 | HR 77 | Ht 70.0 in | Wt 224.4 lb

## 2017-08-14 DIAGNOSIS — G4733 Obstructive sleep apnea (adult) (pediatric): Secondary | ICD-10-CM

## 2017-08-14 DIAGNOSIS — Z951 Presence of aortocoronary bypass graft: Secondary | ICD-10-CM

## 2017-08-14 NOTE — Progress Notes (Signed)
08/14/2017- 61 year old male never smoker for sleep evaluation. FOLLOWS FOR:  snoring is severe at night and told that he stops breathing at night. he stated that since his quad bypass in march he does not feel tired during the day.  Medical problem list includes esophagitis, duodenal ulcer, UGI bleed GERD, MI/CAD/CABG X4 He brings a cell phone recording of his snoring which sounds quite loud. He confirms witnessed apneas but has not noticed daytime sleepiness since energy improved with his bypass graft surgery. No sleep medications and no caffeine. 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 Mother may have OSA. Strong family history of CAD. No ENT surgery or lung disease. Epworth score 6/24  Prior to Admission medications   Medication Sig Start Date End Date Taking? Authorizing Provider  aspirin EC 81 MG tablet Take 1 tablet (81 mg total) by mouth daily. 02/10/17  Yes Dorothy Spark, MD  atorvastatin (LIPITOR) 40 MG tablet Take 1 tablet (40 mg total) by mouth daily at 6 PM. 03/23/17  Yes Dorothy Spark, MD  clopidogrel (PLAVIX) 75 MG tablet Take 1 tablet (75 mg total) by mouth daily. Patient taking differently: Take 75 mg by mouth 2 (two) times daily.  02/10/17  Yes Dorothy Spark, MD  metoprolol tartrate (LOPRESSOR) 25 MG tablet Take 1 tablet (25 mg total) by mouth 2 (two) times daily. 03/23/17  Yes Dorothy Spark, MD  pantoprazole (PROTONIX) 40 MG tablet Take 40 mg by mouth daily as needed.   Yes [provider]  Tadalafil (CIALIS) 2.5 MG TABS Take 1 tablet (2.5 mg total) by mouth every 3 (three) days as needed. 02/26/17  Yes Dorothy Spark, MD   Past Medical History:  Diagnosis Date  . Adenomatous colon polyp   . Gastric erosions   . Gastric polyps   . Gastric ulcer   . GERD (gastroesophageal reflux disease)   . GI bleed due to NSAIDs   . HLD (hyperlipidemia)    Past Surgical History:  Procedure Laterality Date  . CORONARY ARTERY BYPASS  GRAFT N/A 01/22/2017   Procedure: CORONARY ARTERY BYPASS GRAFTING (CABG) times 4 using left internal mammary artery and right greater saphenous leg vein.;  Surgeon: Ivin Poot, MD;  Location: [place];  Service: Open Heart Surgery;  Laterality: N/A;  . ESOPHAGOGASTRODUODENOSCOPY N/A 08/24/2014   Procedure: ESOPHAGOGASTRODUODENOSCOPY (EGD);  Surgeon: Jerene Bears, MD;  Location: [facility];  Service: Endoscopy;  Laterality: N/A;  . LEFT HEART CATH AND CORONARY ANGIOGRAPHY N/A 01/20/2017   Procedure: Left Heart Cath and Coronary Angiography;  Surgeon: Sherren Mocha, MD;  Location: [place] [facility];  Service: Cardiovascular;  Laterality: N/A;  . TEE WITHOUT CARDIOVERSION N/A 01/22/2017   Procedure: TRANSESOPHAGEAL ECHOCARDIOGRAM (TEE);  Surgeon: Ivin Poot, MD;  Location: [place];  Service: Open Heart Surgery;  Laterality: N/A;   Family History  Problem Relation Age of Onset  . Heart disease Father   . Diabetes Father   . Heart disease Sister   . Heart disease Brother   . Liver cancer Maternal Grandmother   . Esophageal cancer Paternal Grandmother    .socv ROS-see HPI   Negative unless "+" Constitutional:    weight loss, night sweats, fevers, chills, fatigue, lassitude. HEENT:    headaches, difficulty swallowing, tooth/dental problems, sore throat,       sneezing, itching, ear ache, nasal congestion, post nasal drip, snoring CV:    chest pain, orthopnea, PND, swelling in lower extremities, anasarca,  dizziness, palpitations Resp:   shortness of breath with exertion or at rest.                productive cough,   non-productive cough, coughing up of blood.              change in color of mucus.  wheezing.   Skin:    rash or lesions. GI:  No-   heartburn, indigestion, abdominal pain, nausea, vomiting, diarrhea,                 change in bowel habits, loss of appetite GU: dysuria, change in color of urine, no urgency or frequency.    flank pain. MS:   joint pain, stiffness, decreased range of motion, back pain. Neuro-     nothing unusual Psych:  change in mood or affect.  depression or anxiety.   memory loss.  OBJ- Physical Exam General- Alert, Oriented, Affect-appropriate, Distress- none acute, + Overweight Skin- rash-none, lesions- none, excoriation- none Lymphadenopathy- none Head- atraumatic            Eyes- Gross vision intact, PERRLA, conjunctivae and secretions clear            Ears- Hearing, canals-normal            Nose- Clear, Septal dev +,  No-mucus, polyps, erosion, perforation             Throat- Mallampati II-III , mucosa clear , drainage- none, tonsils- atrophic Neck- flexible , trachea midline, no stridor , thyroid nl, carotid no bruit Chest - symmetrical excursion , unlabored           Heart/CV- RRR , no murmur , no gallop  , no rub, nl s1 s2                           - JVD- none , edema- none, stasis changes- none, varices- none           Lung- clear to P&A, wheeze- none, cough- none , dullness-none, rub- none           Chest wall-  Abd-  Br/ Gen/ Rectal- Not done, not indicated Extrem- cyanosis- none, clubbing, none, atrophy- none, strength- nl Neuro- grossly intact to observation

## 2017-08-14 NOTE — Assessment & Plan Note (Signed)
High probability diagnosis based on history and physical exam. Appropriate discussion of basic sleep hygiene, sleep apnea, responsibility for safe driving, treatment options, testing process. Plan-schedule sleep study. He will call for results and recommendations but understands from his history, that I'm likely to recommend CPAP.

## 2017-08-14 NOTE — Assessment & Plan Note (Signed)
He noted significantly more energy and sense of well-being after his bypass surgery and is not substantially aware of daytime sleepiness now.

## 2017-08-14 NOTE — Patient Instructions (Signed)
Order- Schedule unattended home sleep test    Dx OSA  Please call me about 2 weeks after your sleep test to ask for results and recommendations. If appropriate we may be able to start treatment for you then.

## 2017-08-24 ENCOUNTER — Ambulatory Visit (INDEPENDENT_AMBULATORY_CARE_PROVIDER_SITE_OTHER): Payer: BLUE CROSS/BLUE SHIELD | Admitting: Internal Medicine

## 2017-08-24 ENCOUNTER — Other Ambulatory Visit (INDEPENDENT_AMBULATORY_CARE_PROVIDER_SITE_OTHER): Payer: BLUE CROSS/BLUE SHIELD

## 2017-08-24 ENCOUNTER — Other Ambulatory Visit: Payer: Self-pay | Admitting: Internal Medicine

## 2017-08-24 ENCOUNTER — Encounter: Payer: Self-pay | Admitting: Internal Medicine

## 2017-08-24 VITALS — BP 118/78 | HR 76 | Temp 97.9°F | Ht 70.0 in | Wt 224.0 lb

## 2017-08-24 DIAGNOSIS — R739 Hyperglycemia, unspecified: Secondary | ICD-10-CM | POA: Insufficient documentation

## 2017-08-24 DIAGNOSIS — Z23 Encounter for immunization: Secondary | ICD-10-CM | POA: Diagnosis not present

## 2017-08-24 DIAGNOSIS — Z0000 Encounter for general adult medical examination without abnormal findings: Secondary | ICD-10-CM

## 2017-08-24 DIAGNOSIS — K259 Gastric ulcer, unspecified as acute or chronic, without hemorrhage or perforation: Secondary | ICD-10-CM | POA: Insufficient documentation

## 2017-08-24 DIAGNOSIS — K317 Polyp of stomach and duodenum: Secondary | ICD-10-CM | POA: Insufficient documentation

## 2017-08-24 DIAGNOSIS — D126 Benign neoplasm of colon, unspecified: Secondary | ICD-10-CM | POA: Insufficient documentation

## 2017-08-24 DIAGNOSIS — E785 Hyperlipidemia, unspecified: Secondary | ICD-10-CM | POA: Diagnosis not present

## 2017-08-24 DIAGNOSIS — K219 Gastro-esophageal reflux disease without esophagitis: Secondary | ICD-10-CM | POA: Insufficient documentation

## 2017-08-24 DIAGNOSIS — I2510 Atherosclerotic heart disease of native coronary artery without angina pectoris: Secondary | ICD-10-CM | POA: Insufficient documentation

## 2017-08-24 LAB — URINALYSIS, ROUTINE W REFLEX MICROSCOPIC
Bilirubin Urine: NEGATIVE
Hgb urine dipstick: NEGATIVE
Ketones, ur: NEGATIVE
LEUKOCYTES UA: NEGATIVE
NITRITE: NEGATIVE
TOTAL PROTEIN, URINE-UPE24: NEGATIVE
UROBILINOGEN UA: 0.2 (ref 0.0–1.0)
Urine Glucose: NEGATIVE
pH: 5.5 (ref 5.0–8.0)

## 2017-08-24 LAB — PSA: PSA: 0.39 ng/mL (ref 0.10–4.00)

## 2017-08-24 LAB — CBC WITH DIFFERENTIAL/PLATELET
Basophils Absolute: 0.1 10*3/uL (ref 0.0–0.1)
Basophils Relative: 0.9 % (ref 0.0–3.0)
EOS ABS: 0.7 10*3/uL (ref 0.0–0.7)
EOS PCT: 7.7 % — AB (ref 0.0–5.0)
HCT: 44.3 % (ref 39.0–52.0)
HEMOGLOBIN: 14.7 g/dL (ref 13.0–17.0)
Lymphocytes Relative: 21.8 % (ref 12.0–46.0)
Lymphs Abs: 2.1 10*3/uL (ref 0.7–4.0)
MCHC: 33.3 g/dL (ref 30.0–36.0)
MCV: 90.7 fl (ref 78.0–100.0)
MONO ABS: 1 10*3/uL (ref 0.1–1.0)
Monocytes Relative: 10.5 % (ref 3.0–12.0)
Neutro Abs: 5.7 10*3/uL (ref 1.4–7.7)
Neutrophils Relative %: 59.1 % (ref 43.0–77.0)
Platelets: 184 10*3/uL (ref 150.0–400.0)
RBC: 4.89 Mil/uL (ref 4.22–5.81)
RDW: 14.3 % (ref 11.5–15.5)
WBC: 9.6 10*3/uL (ref 4.0–10.5)

## 2017-08-24 LAB — LIPID PANEL
CHOLESTEROL: 155 mg/dL (ref 0–200)
HDL: 54.3 mg/dL (ref >39.00)
LDL CALC: 84 mg/dL (ref 0–99)
NonHDL: 101.11
TRIGLYCERIDES: 84 mg/dL (ref 0.0–149.0)
Total CHOL/HDL Ratio: 3
VLDL: 16.8 mg/dL (ref 0.0–40.0)

## 2017-08-24 LAB — BASIC METABOLIC PANEL
BUN: 25 mg/dL — AB (ref 6–23)
CO2: 29 mEq/L (ref 19–32)
Calcium: 9.6 mg/dL (ref 8.4–10.5)
Chloride: 104 mEq/L (ref 96–112)
Creatinine, Ser: 0.92 mg/dL (ref 0.40–1.50)
GFR: 89.31 mL/min (ref >60.00)
GLUCOSE: 83 mg/dL (ref 70–99)
POTASSIUM: 4.7 meq/L (ref 3.5–5.1)
Sodium: 140 mEq/L (ref 135–145)

## 2017-08-24 LAB — HEPATIC FUNCTION PANEL
ALBUMIN: 4 g/dL (ref 3.5–5.2)
ALT: 10 U/L (ref 0–53)
AST: 16 U/L (ref 0–37)
Alkaline Phosphatase: 55 U/L (ref 39–117)
Bilirubin, Direct: 0.2 mg/dL (ref 0.0–0.3)
Total Bilirubin: 1.2 mg/dL (ref 0.2–1.2)
Total Protein: 6.6 g/dL (ref 6.0–8.3)

## 2017-08-24 LAB — HEMOGLOBIN A1C: Hgb A1c MFr Bld: 6 % (ref 4.6–6.5)

## 2017-08-24 LAB — TSH: TSH: 0.92 u[IU]/mL (ref 0.35–4.50)

## 2017-08-24 MED ORDER — ATORVASTATIN CALCIUM 80 MG PO TABS: 80.0000 mg | ORAL_TABLET | Freq: Every day | ORAL | 3 refills | Status: DC

## 2017-08-24 NOTE — Assessment & Plan Note (Signed)
Mild, asymptomatic, strong FH' for a1c with labs

## 2017-08-24 NOTE — Assessment & Plan Note (Signed)

## 2017-08-24 NOTE — Patient Instructions (Addendum)
You had the flu shot today, and the Tetanus shot today  Please continue all other medications as before, and refills have been done if requested.  Please have the pharmacy call with any other refills you may need.  Please continue your efforts at being more active, low cholesterol diet, and weight control.  You are otherwise up to date with prevention measures today.  Please keep your appointments with your specialists as you may have planned  Please go to the LAB in the Basement (turn left off the elevator) for the tests to be done today  You will be contacted by phone if any changes need to be made immediately.  Otherwise, you will receive a letter about your results with an explanation, but please check with MyChart first.  Please remember to sign up for MyChart if you have not done so, as this will be important to you in the future with finding out test results, communicating by private email, and scheduling acute appointments online when needed.  Please return in 1 year for your yearly visit, or sooner if needed, with Lab testing done 3-5 days before

## 2017-08-24 NOTE — Assessment & Plan Note (Signed)
Lab Results  Component Value Date   LDLCALC 90 02/25/2017  goal < 70, for fu lab today, but note pt had been on lipitor probably < 2 wks at last testing

## 2017-08-24 NOTE — Progress Notes (Signed)
Subjective:    Patient ID: [name], male    DOB: 1956/10/28, 61 y.o.   MRN: [id number]  HPI   Here for wellness and f/u;  Overall doing ok;  Pt denies Chest pain, worsening SOB, DOE, wheezing, orthopnea, PND, worsening LE edema, palpitations, dizziness or syncope.  Pt denies neurological change such as new headache, facial or extremity weakness.  Pt denies polydipsia, polyuria, or low sugar symptoms. Pt states overall good compliance with treatment and medications, good tolerability, and has been trying to follow appropriate diet.  Pt denies worsening depressive symptoms, suicidal ideation or panic. No fever, night sweats, wt loss, loss of appetite, or other constitutional symptoms.  Pt states good ability with ADL's, has low fall risk, home safety reviewed and adequate, no other significant changes in hearing or vision, and not overly active with exercise.  Did finish his cardiac rehab over the summer in [place] hep C testing today. No new complaints Past Medical History:  Diagnosis Date  . Adenomatous colon polyp   . Gastric erosions   . Gastric polyps   . Gastric ulcer   . GERD (gastroesophageal reflux disease)   . GI bleed due to NSAIDs   . HLD (hyperlipidemia)   . Non-ST elevation (NSTEMI) myocardial infarction ([place])   . PAF (paroxysmal atrial fibrillation) ([place]) 02/10/2017  . S/P CABG x 4 01/26/2017   Past Surgical History:  Procedure Laterality Date  . CORONARY ARTERY BYPASS GRAFT N/A 01/22/2017   Procedure: CORONARY ARTERY BYPASS GRAFTING (CABG) times 4 using left internal mammary artery and right greater saphenous leg vein.;  Surgeon: Ivin Poot, MD;  Location: [place];  Service: Open Heart Surgery;  Laterality: N/A;  . ESOPHAGOGASTRODUODENOSCOPY N/A 08/24/2014   Procedure: ESOPHAGOGASTRODUODENOSCOPY (EGD);  Surgeon: Jerene Bears, MD;  Location: [facility];  Service: Endoscopy;  Laterality: N/A;  . LEFT HEART CATH AND CORONARY ANGIOGRAPHY N/A 01/20/2017   Procedure: Left Heart Cath and Coronary Angiography;  Surgeon: Sherren Mocha, MD;  Location: [place] [facility];  Service: Cardiovascular;  Laterality: N/A;  . TEE WITHOUT CARDIOVERSION N/A 01/22/2017   Procedure: TRANSESOPHAGEAL ECHOCARDIOGRAM (TEE);  Surgeon: Ivin Poot, MD;  Location: [facility];  Service: Open Heart Surgery;  Laterality: N/A;    reports that he has never smoked. He has never used smokeless tobacco. He reports that he does not drink alcohol or use drugs. family history includes Diabetes in his father; Esophageal cancer in his paternal grandmother; Heart disease in his brother, father, and sister; Liver cancer in his maternal grandmother. Allergies  Allergen Reactions  . Ibuprofen Other (See Comments)    Severe GI Bleed and PUD     Current Outpatient Prescriptions on File Prior to Visit  Medication Sig Dispense Refill  . aspirin EC 81 MG tablet Take 1 tablet (81 mg total) by mouth daily. 90 tablet 3  . atorvastatin (LIPITOR) 40 MG tablet Take 1 tablet (40 mg total) by mouth daily at 6 PM. 90 tablet 3  . metoprolol tartrate (LOPRESSOR) 25 MG tablet Take 1 tablet (25 mg total) by mouth 2 (two) times daily. 180 tablet 3  . pantoprazole (PROTONIX) 40 MG tablet Take 40 mg by mouth daily as needed.    . Tadalafil (CIALIS) 2.5 MG TABS Take 1 tablet (2.5 mg total) by mouth every 3 (three) days as needed. 10 each 5   No current facility-administered medications on file prior to visit.    Review of Systems Constitutional: Negative for other  unusual diaphoresis, sweats, appetite or weight changes HENT: Negative for other worsening hearing loss, ear pain, facial swelling, mouth sores or neck stiffness.   Eyes: Negative for other worsening pain, redness or other visual disturbance.  Respiratory: Negative for other stridor or swelling Cardiovascular: Negative for other palpitations or other chest pain  Gastrointestinal: Negative for worsening diarrhea or loose stools, blood in stool,  distention or other pain Genitourinary: Negative for hematuria, flank pain or other change in urine volume.  Musculoskeletal: Negative for myalgias or other joint swelling.  Skin: Negative for other color change, or other wound or worsening drainage.  Neurological: Negative for other syncope or numbness. Hematological: Negative for other adenopathy or swelling Psychiatric/Behavioral: Negative for hallucinations, other worsening agitation, SI, self-injury, or new decreased concentration All other system neg per pt    Objective:   Physical Exam BP 118/78   Pulse 76   Temp 97.9 F (36.6 C) (Oral)   Ht 5\' 10"  (1.778 m)   Wt 224 lb (101.6 kg)   SpO2 99%   BMI 32.14 kg/m  VS noted,  Constitutional: Pt is oriented to person, place, and time. Appears well-developed and well-nourished, in no significant distress and comfortable Head: Normocephalic and atraumatic  Eyes: Conjunctivae and EOM are normal. Pupils are equal, round, and reactive to light Right Ear: External ear normal without discharge Left Ear: External ear normal without discharge Nose: Nose without discharge or deformity Mouth/Throat: Oropharynx is without other ulcerations and moist  Neck: Normal range of motion. Neck supple. No JVD present. No tracheal deviation present or significant neck LA or mass Cardiovascular: Normal rate, regular rhythm, normal heart sounds and intact distal pulses.   Pulmonary/Chest: WOB normal and breath sounds without rales or wheezing  Abdominal: Soft. Bowel sounds are normal. NT. No HSM  Musculoskeletal: Normal range of motion. Exhibits no edema Lymphadenopathy: Has no other cervical adenopathy.  Neurological: Pt is alert and oriented to person, place, and time. Pt has normal reflexes. No cranial nerve deficit. Motor grossly intact, Gait intact Skin: Skin is warm and dry. No rash noted or new ulcerations Psychiatric:  Has normal mood and affect. Behavior is normal without agitation No other exam  findings Lab Results  Component Value Date   WBC 7.5 02/25/2017   HGB 13.1 02/25/2017   HCT 40.2 02/25/2017   PLT 199 02/25/2017   GLUCOSE 112 (H) 02/25/2017   CHOL 142 02/25/2017   TRIG 49 02/25/2017   HDL 42 02/25/2017   LDLDIRECT 161.3 10/03/2008   LDLCALC 90 02/25/2017   ALT 18 02/25/2017   AST 14 02/25/2017   NA 139 02/25/2017   K 5.2 02/25/2017   CL 100 02/25/2017   CREATININE 1.07 02/25/2017   BUN 19 02/25/2017   CO2 23 02/25/2017   TSH 1.780 02/25/2017   PSA 0.46 10/03/2008   INR 1.33 01/22/2017   HGBA1C 6.0 (H) 01/21/2017       Assessment & Plan:

## 2017-08-24 NOTE — Addendum Note (Signed)
Addended by: Biagio Borg on: 08/24/2017 02:00 PM   Modules accepted: Level of Service

## 2017-08-25 ENCOUNTER — Telehealth: Payer: Self-pay

## 2017-08-25 NOTE — Telephone Encounter (Signed)
-----   Message from Biagio Borg, MD sent at 08/24/2017  5:25 PM EDT ----- Left message on MyChart, pt to cont same tx except  The test results show that your current treatment is OK, except the LDL cholesterol is still too high (although it is improved).  Please increase the lipitor to 80 mg per day, as this should be effective.  A new prescription will be sent, and you should be notified by the office.    Shirron to please inform pt, I will do rx

## 2017-08-25 NOTE — Telephone Encounter (Signed)
Called pt, LVM.   

## 2017-08-26 ENCOUNTER — Telehealth: Payer: Self-pay | Admitting: Cardiology

## 2017-08-26 NOTE — Telephone Encounter (Signed)
New Message  Pt c/o medication issue:  1. Name of Medication: Plavix   2. How are you currently taking this medication (dosage and times per day)? 75mg    3. Are you having a reaction (difficulty breathing--STAT)? no  4. What is your medication issue? Pt would like to speak with RN. Pt states his PCP Dr. Jenny Reichmann was to increase his medication. Pt would like his notes reviewed from Dr. Jenny Reichmann and a call back to discuss the increase. Please call back.

## 2017-08-26 NOTE — Telephone Encounter (Signed)
Patient calling and states that he recently had labs done at Dr. Gwynn Burly office and that he was instructed to increase his lipitor to 80 mg daily (results in EPIC). Patient wanted to call and let his cardiologist know. Patient states that he has seen Dr. Meda Coffee in the past, but is switching to Dr. Burt Knack. Patient has appointment with Dr. Burt Knack on 11/15. Patient made aware that the information would be forwarded to Dr. Burt Knack as [name]. Patient verbalized understanding and thanked me for the call.

## 2017-08-27 NOTE — Telephone Encounter (Signed)
Labs reviewed - agree with plan. thanks

## 2017-08-28 ENCOUNTER — Telehealth: Payer: Self-pay

## 2017-08-28 NOTE — Telephone Encounter (Signed)
I called the pt to advise him that his insurance will not cover his Cialis. The pt stated that he was aware of this and that he has been paying for Cialis out of pocket. He was not aware that his pharmacy was attempting a PA.  He states that he has been told that Cialis 5 mg has a generic but the pill cannot be split in two and that the 2.5 mg tablet has not got a generic yet. The pt takes 2.5 mg and wonders if he can safely take 5 mg so the cost maybe less for him. He is advised that I will send this message to Dr Meda Coffee and her nurse for advisement and that we will contact him with her recommendation. He verbalized understanding.

## 2017-08-31 MED ORDER — TADALAFIL 5 MG PO TABS: ORAL_TABLET | ORAL | 3 refills | Status: DC

## 2017-08-31 MED ORDER — TADALAFIL 2.5 MG PO TABS: ORAL_TABLET | ORAL | 3 refills | Status: DC

## 2017-08-31 NOTE — Telephone Encounter (Signed)
**Note De-Identified [name] Obfuscation** The pt is advised and he verbalized understanding. RX sent to [facility] in [place] to fill.

## 2017-08-31 NOTE — Telephone Encounter (Signed)
It is okay but he needs to make sure he doesn't take any sublingual nitroglycerin 3 days after he takes Cialis.

## 2017-09-01 DIAGNOSIS — G4733 Obstructive sleep apnea (adult) (pediatric): Secondary | ICD-10-CM | POA: Diagnosis not present

## 2017-09-02 DIAGNOSIS — G4733 Obstructive sleep apnea (adult) (pediatric): Secondary | ICD-10-CM | POA: Diagnosis not present

## 2017-09-09 ENCOUNTER — Encounter: Payer: Self-pay | Admitting: Internal Medicine

## 2017-09-09 DIAGNOSIS — H9190 Unspecified hearing loss, unspecified ear: Secondary | ICD-10-CM

## 2017-09-13 ENCOUNTER — Encounter: Payer: Self-pay | Admitting: Internal Medicine

## 2017-09-14 NOTE — Telephone Encounter (Signed)
Referral is already done as of oct 18

## 2017-09-16 ENCOUNTER — Telehealth: Payer: Self-pay | Admitting: Internal Medicine

## 2017-09-16 NOTE — Telephone Encounter (Signed)
Spoke with pt, he states he was looking for his sleep study results and CY told him if he didn't hear from him in 2 weeks then to call. CY please advise.

## 2017-09-17 NOTE — Telephone Encounter (Signed)
Results are being scanned into computer- I can't access them yet.

## 2017-09-17 NOTE — Telephone Encounter (Signed)
Pt returned call and he was informed of Dr. Janee Morn message.  -tr

## 2017-09-17 NOTE — Telephone Encounter (Signed)
lmtcb x1 for pt to make him aware of below message. 

## 2017-09-22 ENCOUNTER — Telehealth: Payer: Self-pay | Admitting: Internal Medicine

## 2017-09-22 ENCOUNTER — Other Ambulatory Visit: Payer: Self-pay | Admitting: *Deleted

## 2017-09-22 DIAGNOSIS — G4733 Obstructive sleep apnea (adult) (pediatric): Secondary | ICD-10-CM

## 2017-09-22 NOTE — Telephone Encounter (Signed)
His home sleep test confirmed severe obstructive sleep apnea, averaging 61 apneas per hour with drops in blood oxygen level. I recommend we order new DME, new CPAP auto 5-20, mask of choice, supplies, humidifier, Airview    Dx OSA  Please make sure he has a return office visit within 31-90 days, per insurance regulations

## 2017-09-22 NOTE — Telephone Encounter (Signed)
Copy of HST results attached to phone note and placed on CY's cart to review and advise.

## 2017-09-22 NOTE — Telephone Encounter (Signed)
Spoke with the pt and notified of recs per CDY  He verbalized understanding  Order sent to [facility]  He has ov pending for 12/03/17 and is aware to call to change if needed to meet the 31-90 day window

## 2017-09-25 ENCOUNTER — Encounter: Payer: Self-pay | Admitting: Cardiovascular Disease

## 2017-09-30 DIAGNOSIS — G4733 Obstructive sleep apnea (adult) (pediatric): Secondary | ICD-10-CM | POA: Diagnosis not present

## 2017-10-01 ENCOUNTER — Encounter: Payer: Self-pay | Admitting: Cardiovascular Disease

## 2017-10-05 NOTE — Telephone Encounter (Signed)
This encounter was created in error - please disregard.

## 2017-10-08 ENCOUNTER — Encounter: Payer: Self-pay | Admitting: Cardiovascular Disease

## 2017-10-08 ENCOUNTER — Ambulatory Visit (INDEPENDENT_AMBULATORY_CARE_PROVIDER_SITE_OTHER): Payer: BLUE CROSS/BLUE SHIELD | Admitting: Cardiovascular Disease

## 2017-10-08 VITALS — BP 140/78 | HR 70 | Ht 70.0 in | Wt 226.8 lb

## 2017-10-08 DIAGNOSIS — I255 Ischemic cardiomyopathy: Secondary | ICD-10-CM | POA: Diagnosis not present

## 2017-10-08 DIAGNOSIS — H11001 Unspecified pterygium of right eye: Secondary | ICD-10-CM | POA: Diagnosis not present

## 2017-10-08 DIAGNOSIS — I2510 Atherosclerotic heart disease of native coronary artery without angina pectoris: Secondary | ICD-10-CM | POA: Diagnosis not present

## 2017-10-08 DIAGNOSIS — E785 Hyperlipidemia, unspecified: Secondary | ICD-10-CM

## 2017-10-08 NOTE — Progress Notes (Signed)
Cardiology Office Note Date:  10/08/2017   ID:  [name], DOB 05-27-1956, MRN [id number]  PCP:  Biagio Borg, MD  Cardiologist:  Sherren Mocha, MD    Chief Complaint  Patient presents with  . Coronary Artery Disease     History of Present Illness: [name] is a 61 y.o. male who presents for follow-up of coronary artery disease.  He presented in February 2018 with non-STEMI.  He was found to have severe multivessel coronary artery disease and ultimately was treated with four-vessel CABG with a LIMA to LAD, saphenous vein graft to diagonal, saphenous vein graft to ramus intermedius, and saphenous vein graft to right PDA.  His postoperative course was complicated only by atrial fibrillation and he was treated with amiodarone to maintain sinus rhythm.  This was discontinued in his early outpatient follow-up.  He is here alone today. Feels like he's doing very well. He's been active and is having no exertional symptoms. Started CPAP for treatment of sleep apnea 3 days and is already feeling better.  He denies chest pain, chest pressure, or shortness of breath.  He is exercising regularly without symptoms.  He notes his blood pressure is a little elevated today and states that he has a lot of stress going on at work.  Otherwise his blood pressure has been running very good range with readings ranging from 96-120/67-80.   Past Medical History:  Diagnosis Date  . Adenomatous colon polyp   . Gastric erosions   . Gastric polyps   . Gastric ulcer   . GERD (gastroesophageal reflux disease)   . GI bleed due to NSAIDs   . HLD (hyperlipidemia)   . Non-ST elevation (NSTEMI) myocardial infarction ([place])   . PAF (paroxysmal atrial fibrillation) ([place]) 02/10/2017  . S/P CABG x 4 01/26/2017    Past Surgical History:  Procedure Laterality Date  . CORONARY ARTERY BYPASS GRAFT N/A 01/22/2017   Procedure: CORONARY ARTERY BYPASS GRAFTING (CABG) times 4 using left internal mammary artery and  right greater saphenous leg vein.;  Surgeon: Ivin Poot, MD;  Location: [facility];  Service: Open Heart Surgery;  Laterality: N/A;  . ESOPHAGOGASTRODUODENOSCOPY N/A 08/24/2014   Procedure: ESOPHAGOGASTRODUODENOSCOPY (EGD);  Surgeon: Jerene Bears, MD;  Location: [facility];  Service: Endoscopy;  Laterality: N/A;  . LEFT HEART CATH AND CORONARY ANGIOGRAPHY N/A 01/20/2017   Procedure: Left Heart Cath and Coronary Angiography;  Surgeon: Sherren Mocha, MD;  Location: [facility];  Service: Cardiovascular;  Laterality: N/A;  . TEE WITHOUT CARDIOVERSION N/A 01/22/2017   Procedure: TRANSESOPHAGEAL ECHOCARDIOGRAM (TEE);  Surgeon: Ivin Poot, MD;  Location: [facility];  Service: Open Heart Surgery;  Laterality: N/A;    Current Outpatient Medications  Medication Sig Dispense Refill  . aspirin EC 81 MG tablet Take 1 tablet (81 mg total) by mouth daily. 90 tablet 3  . atorvastatin (LIPITOR) 80 MG tablet Take 1 tablet (80 mg total) by mouth daily. 90 tablet 3  . clopidogrel (PLAVIX) 75 MG tablet Take 75 mg by mouth daily.    . metoprolol tartrate (LOPRESSOR) 25 MG tablet Take 1 tablet (25 mg total) by mouth 2 (two) times daily. 180 tablet 3  . Omega 3 1200 MG CAPS Take by mouth.    [name] OVER THE COUNTER MEDICATION Take 3 capsules daily by mouth.     [name] OVER THE COUNTER MEDICATION Take 1 Scoop daily by mouth. Vital Reds Powder mix with water    . pantoprazole (PROTONIX)  40 MG tablet Take 40 mg by mouth daily as needed.    . tadalafil (CIALIS) 5 MG tablet Take 1 tablet PO every 3 days as needed. Do not take nitroglycerin for 3 days after Cialis dose. 10 tablet 3   No current facility-administered medications for this visit.     Allergies:   Ibuprofen   Social History:  The patient  reports that  has never smoked. he has never used smokeless tobacco. He reports that he does not drink alcohol or use drugs.   Family History:  The patient's family history includes Diabetes in his father; Esophageal cancer  in his paternal grandmother; Heart disease in his brother, father, and sister; Liver cancer in his maternal grandmother.   ROS:  Please see the history of present illness.  Otherwise, review of systems is positive for easy bruising.  All other systems are reviewed and negative.   PHYSICAL EXAM: VS:  BP 140/78   Pulse 70   Ht 5\' 10"  (1.778 m)   Wt 226 lb 12.8 oz (102.9 kg)   BMI 32.54 kg/m  , BMI Body mass index is 32.54 kg/m. GEN: Well nourished, well developed, in no acute distress  HEENT: normal  Neck: no JVD, no masses. No carotid bruits Cardiac: RRR without murmur or gallop     Respiratory:  clear to auscultation bilaterally, normal work of breathing GI: soft, nontender, nondistended, + BS MS: no deformity or atrophy  Ext: no pretibial edema, pedal pulses 2+= bilaterally Skin: warm and dry, no rash Neuro:  Strength and sensation are intact Psych: euthymic mood, full affect  EKG:  EKG is ordered today. The ekg ordered today shows NSR 70 bpm, within normal limits  Recent Labs: 01/23/2017: Magnesium 2.5 08/24/2017: ALT 10; BUN 25; Creatinine, Ser 0.92; Hemoglobin 14.7; Platelets 184.0; Potassium 4.7; Sodium 140; TSH 0.92   Lipid Panel     Component Value Date/Time   CHOL 155 08/24/2017 1401   CHOL 142 02/25/2017 0728   TRIG 84.0 08/24/2017 1401   HDL 54.30 08/24/2017 1401   HDL 42 02/25/2017 0728   CHOLHDL 3 08/24/2017 1401   VLDL 16.8 08/24/2017 1401   LDLCALC 84 08/24/2017 1401   LDLCALC 90 02/25/2017 0728   LDLDIRECT 161.3 10/03/2008 0926      Wt Readings from Last 3 Encounters:  10/08/17 226 lb 12.8 oz (102.9 kg)  08/24/17 224 lb (101.6 kg)  08/14/17 224 lb 6 oz (101.8 kg)     Cardiac Studies Reviewed: Echo 02/25/2017: Study Conclusions  - Procedure narrative: Transthoracic echocardiography for left   ventricular function evaluation, for right ventricular function   evaluation, and for assessment of valvular function. Image   quality was adequate.  Intravenous contrast (Definity) was   administered to enhance regional wall motion assessment and   opacify the LV. - Left ventricle: The cavity size was normal. Wall thickness was   normal. Systolic function was normal. The estimated ejection   fraction was in the range of 50% to 55%. Left ventricular   diastolic function parameters were normal. - Aortic valve: Trileaflet. Sclerosis without stenosis. There was   no regurgitation. - Left atrium: The atrium was normal in size. - Atrial septum: Aneurysmal IAS - cannot r/o small PFO. - Inferior vena cava: The vessel was normal in size. The   respirophasic diameter changes were in the normal range (>= 50%),   consistent with normal central venous pressure.  Impressions:  - Technically difficult study. Defnity contrast given. LVEF 50-55%,  mild inferior hypokinesis, normal diastolic function, normal   biatrial size, aneurysmal IAS - cannot r/o small PFO, normal IVC.  Cardiac Catheterization 01-20-2017: Conclusion   1. Severe multivessel coronary artery disease with critical stenosis of the distal RCA and mid LAD, severe stenosis of the ramus intermedius and diagonal branches, and moderate stenosis of the distal circumflex 2. Moderate segmental LV systolic dysfunction with akinesis of the basal and midinferior walls  Recommendation: The patient is chest pain-free. The RCA appears to be his culprit vessel with critical stenosis and a corresponding wall motion abnormality on ventriculography. With severe multivessel CAD and LV dysfunction, I think he would receive mortality benefit and better long-term symptom relief from multivessel CABG. A cardiac surgery consultation will be placed.   Indications   Non-ST elevation (NSTEMI) myocardial infarction ([place]) [I21.4 (ICD-10-CM)]  Procedural Details/Technique   Technical Details INDICATION: NSTEMI  PROCEDURAL DETAILS: The right wrist was prepped, draped, and anesthetized with 1% lidocaine.  Using the modified Seldinger technique, a 5/6 French Slender sheath was introduced into the right radial artery. 3 mg of verapamil was administered through the sheath, weight-based unfractionated heparin was administered intravenously. Standard Judkins catheters were used for selective coronary angiography and left ventriculography. Catheter exchanges were performed over an exchange length guidewire. There were no immediate procedural complications. A TR band was used for radial hemostasis at the completion of the procedure. The patient was transferred to the post catheterization recovery area for further monitoring.    Estimated blood loss <50 mL.  During this procedure the patient was administered the following to achieve and maintain moderate conscious sedation: Versed 2 mg, Fentanyl 25 mcg, while the patient's heart rate, blood pressure, and oxygen saturation were continuously monitored. The period of conscious sedation was 22 minutes, of which I was present face-to-face 100% of this time.  Coronary Findings   Diagnostic  Dominance: Right  Left Main  The left main is patent with no obstructive disease.  Left Anterior Descending  Prox LAD to Mid LAD lesion 95% stenosed  The LAD has complex disease in the midsegment from the first septal perforator ending proximal to the second diagonal branch. There is an eccentric 90% stenosis followed by a 95% stenosis. The mid and distal LAD beyond the second diagonal have mild diffuse disease with no high-grade obstruction. The apical LAD wraps around the inferoapex of the heart.  First Diagonal Branch  Ost 1st Diag to 1st Diag lesion 75% stenosed  Ost 1st Diag to 1st Diag lesion.  1st Diag lesion 80% stenosed  The first diagonal branch is large in caliber. There is moderate to severe ostial disease and a sequential 80% lesion in the diagonal branch.  Ramus Intermedius  Ramus lesion 90% stenosed  There is a 90% lesion in the midportion of the ramus  intermedius branch.  Left Circumflex  Mid Cx to Dist Cx lesion 75% stenosed  Mid Cx to Dist Cx lesion.  Right Coronary Artery  The right coronary artery is diffusely diseased. The vessel is large in caliber. There are tandem critical lesions in the distal RCA. The PDA is diffusely diseased with an eccentric 80% proximal stenosis followed by diffuse mid and distal vessel disease. The PLA branch is small in caliber.  Dist RCA-1 lesion 95% stenosed  Dist RCA-1 lesion.  Dist RCA-2 lesion 90% stenosed  Dist RCA-2 lesion.  Right Posterior Descending Artery  RPDA lesion 80% stenosed  RPDA lesion.  Intervention   No interventions have been documented.  Wall Motion  Left Heart   Left Ventricle The left ventricular size is normal. There is moderate left ventricular systolic dysfunction. There are LV function abnormalities due to segmental dysfunction. There is moderate segmental LV dysfunction with akinesis of the basal midinferior wall, LVEF estimated at 45%.  Coronary Diagrams   Diagnostic Diagram          ASSESSMENT AND PLAN: 1.  CAD, native vessel, without angina: Patient was treated with multivessel CABG earlier this year.  He is managed with dual antiplatelet therapy using aspirin and clopidogrel.  He should continue clopidogrel for a period of 1 year and then can discontinue and remain on aspirin 81 mg daily.  2.  Hyperlipidemia: Recent lipids reviewed with a cholesterol of 155, LDL 84, HDL 54.  The patient's atorvastatin was increased from 40-80 mg daily with repeat labs arranged.  Managed by Dr. Jenny Reichmann.  Overall I think the patient is doing very well.  We discussed continued focus on exercise and dietary changes and establish a goal weight of 210 pounds when he returns for follow-up in 6 months.  At that time consider discontinuation of clopidogrel.  Current medicines are reviewed with the patient today.  The patient does not have concerns regarding  medicines.  Labs/ tests ordered today include:   Orders Placed This Encounter  Procedures  . EKG 12-Lead   Disposition:   FU 6 months  Signed, Sherren Mocha, MD  10/08/2017 10:22 AM    [facility] [address], [place], [place]  [address] Phone: [phone]; Fax: [phone]

## 2017-10-08 NOTE — Patient Instructions (Addendum)

## 2017-10-19 DIAGNOSIS — H903 Sensorineural hearing loss, bilateral: Secondary | ICD-10-CM | POA: Diagnosis not present

## 2017-10-30 DIAGNOSIS — G4733 Obstructive sleep apnea (adult) (pediatric): Secondary | ICD-10-CM | POA: Diagnosis not present

## 2017-11-04 DIAGNOSIS — G4733 Obstructive sleep apnea (adult) (pediatric): Secondary | ICD-10-CM | POA: Diagnosis not present

## 2017-11-04 DIAGNOSIS — G4731 Primary central sleep apnea: Secondary | ICD-10-CM | POA: Diagnosis not present

## 2017-11-16 ENCOUNTER — Other Ambulatory Visit: Payer: Self-pay

## 2017-11-16 DIAGNOSIS — G4733 Obstructive sleep apnea (adult) (pediatric): Secondary | ICD-10-CM

## 2017-11-16 MED ORDER — PANTOPRAZOLE SODIUM 40 MG PO TBEC
40.0000 mg | DELAYED_RELEASE_TABLET | Freq: Every day | ORAL | 0 refills | Status: DC | PRN
Start: 2017-11-16 — End: 2018-10-28

## 2017-12-03 ENCOUNTER — Encounter: Payer: Self-pay | Admitting: Internal Medicine

## 2017-12-03 ENCOUNTER — Ambulatory Visit (INDEPENDENT_AMBULATORY_CARE_PROVIDER_SITE_OTHER): Payer: BLUE CROSS/BLUE SHIELD | Admitting: Internal Medicine

## 2017-12-03 VITALS — BP 120/78 | HR 65 | Ht 70.0 in | Wt 237.6 lb

## 2017-12-03 DIAGNOSIS — G4733 Obstructive sleep apnea (adult) (pediatric): Secondary | ICD-10-CM

## 2017-12-03 DIAGNOSIS — I2510 Atherosclerotic heart disease of native coronary artery without angina pectoris: Secondary | ICD-10-CM

## 2017-12-03 NOTE — Patient Instructions (Signed)
Order- Change DME to [facility] for CPAP auto 5-20, mask of choice, humidifier, supplies, AirView   Dx OSA   Please call as needed

## 2017-12-03 NOTE — Progress Notes (Signed)
08/14/2017- 62 year old male never smoker for sleep evaluation. FOLLOWS FOR:  snoring is severe at night and told that he stops breathing at night. he stated that since his quad bypass in march he does not feel tired during the day.  Medical problem list includes esophagitis, duodenal ulcer, UGI bleed GERD, MI/CAD/CABG X4 He brings a cell phone recording of his snoring which sounds quite loud. He confirms witnessed apneas but has not noticed daytime sleepiness since energy improved with his bypass graft surgery. No sleep medications and no caffeine. 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 Mother may have OSA. Strong family history of CAD. No ENT surgery or lung disease. Epworth score 6/24  12/03/17-62 year old male never smoker followed for OSA, complicated by  duodenal ulcer, UGI bleed, GERD, MI/CAD/CABG x4 HST-09/01/17-AHI 61.5/hour, desaturation to 74%, body weight 224 pounds CPAP auto 5-20/[facility]   [facility] had not put him into AirView as requested Patient is requesting change DME ----OSA; DME [facility]. Pt wears CPAP nightly  CPAP download 90% compliance, AHI 8.2/hour with residual events mostly centrals.  He is comfortable with current pressures, feels that he is sleeping well, and his nasal mask seems to be providing good seal.  ROS-see HPI   + = positive Constitutional:    weight loss, night sweats, fevers, chills, fatigue, lassitude. HEENT:    headaches, difficulty swallowing, tooth/dental problems, sore throat,       sneezing, itching, ear ache, nasal congestion, post nasal drip, snoring CV:    chest pain, orthopnea, PND, swelling in lower extremities, anasarca,                                                        dizziness, palpitations Resp:   shortness of breath with exertion or at rest.                productive cough,   non-productive cough, coughing up of blood.              change in color of mucus.  wheezing.   Skin:    rash or lesions. GI:  No-    heartburn, indigestion, abdominal pain, nausea, vomiting, diarrhea,                 change in bowel habits, loss of appetite GU: dysuria, change in color of urine, no urgency or frequency.   flank pain. MS:   joint pain, stiffness, decreased range of motion, back pain. Neuro-     nothing unusual Psych:  change in mood or affect.  depression or anxiety.   memory loss.  OBJ- Physical Exam General- Alert, Oriented, Affect-appropriate, Distress- none acute, + Overweight Skin- rash-none, lesions- none, excoriation- none Lymphadenopathy- none Head- atraumatic            Eyes- Gross vision intact, PERRLA, conjunctivae and secretions clear            Ears- Hearing, canals-normal            Nose- Clear, Septal dev +,  No-mucus, polyps, erosion, perforation             Throat- Mallampati II-III , mucosa clear , drainage- none, tonsils- atrophic Neck- flexible , trachea midline, no stridor , thyroid nl, carotid no bruit Chest - symmetrical excursion , unlabored  Heart/CV- RRR , no murmur , no gallop  , no rub, nl s1 s2                           - JVD- none , edema- none, stasis changes- none, varices- none           Lung- clear to P&A, wheeze- none, cough- none , dullness-none, rub- none           Chest wall-  Abd-  Br/ Gen/ Rectal- Not done, not indicated Extrem- cyanosis- none, clubbing, none, atrophy- none, strength- nl Neuro- grossly intact to observation

## 2017-12-05 NOTE — Assessment & Plan Note (Signed)
Denies recent change in status or acute events.  Followed by cardiology.

## 2017-12-05 NOTE — Assessment & Plan Note (Addendum)
Benefitting from CPAP, sleeping better.  Residual central apneas are not enough to make me think they are of medical concern.  They most likely reflect some delay in cerebrovascular perfusion with his history of CAD.  I do not think a change to BiPAP ST or NIV device is going to improve his quality of life.  We will continue to watch that issue.

## 2017-12-22 ENCOUNTER — Other Ambulatory Visit: Payer: Self-pay | Admitting: Cardiovascular Disease

## 2017-12-22 MED ORDER — METOPROLOL TARTRATE 25 MG PO TABS: 25.0000 mg | ORAL_TABLET | Freq: Two times a day (BID) | ORAL | 3 refills | Status: DC

## 2018-01-11 DIAGNOSIS — G4733 Obstructive sleep apnea (adult) (pediatric): Secondary | ICD-10-CM | POA: Diagnosis not present

## 2018-01-29 ENCOUNTER — Telehealth: Payer: Self-pay | Admitting: *Deleted

## 2018-01-29 NOTE — Telephone Encounter (Signed)
[facility] requesting a refill on clopidogrel for the patient. Per 10/08/17 office visit with Dr Burt Knack, patient will continue DAPT for 1 year. Patient was started on clopidogrel 02/10/17. Please advise. Thanks, MI

## 2018-01-29 NOTE — Telephone Encounter (Signed)
Yes ok for him to stop plavix. thx!

## 2018-01-31 ENCOUNTER — Other Ambulatory Visit: Payer: Self-pay | Admitting: Cardiology

## 2018-02-01 NOTE — Telephone Encounter (Signed)
Conversation: Medication Management  (Newest Message First)  February 01, 2018  Theodoro Parma, RN    11:36 AM  Note    Instructed patient to STOP PLAVIX. He was grateful for call and agrees with treatment plan.        11:36 AM    Nicholes Rough contacted Theodoro Parma, RN  Theodoro Parma, RN     11:07 AM  Note    Left message to call back.        11:07 AM    Theodoro Parma, RN contacted (Name not recorded)  January 29, 2018  Sherren Mocha, MD  to Theodoro Parma, RN   6:08 PM  Note    Yes ok for him to stop plavix. thx!    Theodoro Parma, RN  to Sherren Mocha, MD     5:07 PM  OK to discontinue? CABG February 2018. Started Plavix 02/10/2017. Thanks!     4:36 PM  You routed this conversation to Theodoro Parma, RN  Me  4:31 PM  Note    [facility] on clop[facility]rel for the patient. Per 10/08/17 office visit with Dr Burt Knack, patient will continue DAPT for 1 year. Patient was started on clopidogrel 02/10/17. Please advise. Thanks, MI

## 2018-02-01 NOTE — Telephone Encounter (Signed)
Instructed patient to STOP PLAVIX. He was grateful for call and agrees with treatment plan.

## 2018-02-01 NOTE — Telephone Encounter (Signed)
Left message to call back  

## 2018-02-17 ENCOUNTER — Other Ambulatory Visit: Payer: Self-pay | Admitting: Physician Assistant

## 2018-02-17 DIAGNOSIS — G4733 Obstructive sleep apnea (adult) (pediatric): Secondary | ICD-10-CM

## 2018-03-09 ENCOUNTER — Encounter: Payer: Self-pay | Admitting: Internal Medicine

## 2018-03-11 DIAGNOSIS — G4733 Obstructive sleep apnea (adult) (pediatric): Secondary | ICD-10-CM | POA: Diagnosis not present

## 2018-03-30 ENCOUNTER — Telehealth: Payer: Self-pay | Admitting: Cardiovascular Disease

## 2018-03-30 NOTE — Telephone Encounter (Signed)
Left message for patient to come fasting as there are no repeat labs since increasing statin last fall.  Instructed him to call with questions or concerns.

## 2018-03-30 NOTE — Telephone Encounter (Signed)
New Message:       Pt is needing to know if he needs to get labs done tomorrow while at his appt. If so does he need to fast. Pt states to call and leave a vm if he is unavailable.

## 2018-03-31 ENCOUNTER — Ambulatory Visit (INDEPENDENT_AMBULATORY_CARE_PROVIDER_SITE_OTHER): Payer: BLUE CROSS/BLUE SHIELD | Admitting: Cardiovascular Disease

## 2018-03-31 ENCOUNTER — Encounter: Payer: Self-pay | Admitting: Cardiovascular Disease

## 2018-03-31 ENCOUNTER — Ambulatory Visit: Payer: BLUE CROSS/BLUE SHIELD | Admitting: Internal Medicine

## 2018-03-31 VITALS — BP 122/68 | HR 55 | Ht 70.0 in | Wt 239.0 lb

## 2018-03-31 DIAGNOSIS — E785 Hyperlipidemia, unspecified: Secondary | ICD-10-CM | POA: Diagnosis not present

## 2018-03-31 DIAGNOSIS — I2510 Atherosclerotic heart disease of native coronary artery without angina pectoris: Secondary | ICD-10-CM | POA: Diagnosis not present

## 2018-03-31 LAB — LIPID PANEL
CHOL/HDL RATIO: 2.5 ratio (ref 0.0–5.0)
Cholesterol, Total: 127 mg/dL (ref 100–199)
HDL: 51 mg/dL (ref >39)
LDL Calculated: 69 mg/dL (ref 0–99)
Triglycerides: 37 mg/dL (ref 0–149)
VLDL CHOLESTEROL CAL: 7 mg/dL (ref 5–40)

## 2018-03-31 MED ORDER — METOPROLOL SUCCINATE ER 25 MG PO TB24: 25.0000 mg | ORAL_TABLET | Freq: Every day | ORAL | 3 refills | Status: DC

## 2018-03-31 NOTE — Progress Notes (Signed)
Cardiology Office Note Date:  03/31/2018   ID:  [name], DOB 07-Oct-1956, MRN [id number]  PCP:  Biagio Borg, MD  Cardiologist:  Sherren Mocha, MD    Chief Complaint  Patient presents with  . Follow-up    CAD     History of Present Illness: [name] is a 62 y.o. male who presents for follow-up of coronary artery disease.  He presented in February 2018 with non-STEMI.  He was found to have severe multivessel coronary artery disease and ultimately was treated with four-vessel CABG with a LIMA to LAD, saphenous vein graft to diagonal, saphenous vein graft to ramus intermedius, and saphenous vein graft to right PDA.  His postoperative course was complicated only by atrial fibrillation and he was treated with amiodarone to maintain sinus rhythm.  This was discontinued in his early outpatient follow-up.  He is here alone today. Feeling well. Energy is improved. He's exercising on a regular basis with 30 minutes of treadmill in the mornings along with weight lifting. Today, he denies symptoms of palpitations, chest pain, shortness of breath, orthopnea, PND, lower extremity edema, dizziness, or syncope.   Past Medical History:  Diagnosis Date  . Adenomatous colon polyp   . Gastric erosions   . Gastric polyps   . Gastric ulcer   . GERD (gastroesophageal reflux disease)   . GI bleed due to NSAIDs   . HLD (hyperlipidemia)   . Non-ST elevation (NSTEMI) myocardial infarction ([place])   . PAF (paroxysmal atrial fibrillation) ([place]) 02/10/2017  . S/P CABG x 4 01/26/2017    Past Surgical History:  Procedure Laterality Date  . CORONARY ARTERY BYPASS GRAFT N/A 01/22/2017   Procedure: CORONARY ARTERY BYPASS GRAFTING (CABG) times 4 using left internal mammary artery and right greater saphenous leg vein.;  Surgeon: Ivin Poot, MD;  Location: [facility];  Service: Open Heart Surgery;  Laterality: N/A;  . ESOPHAGOGASTRODUODENOSCOPY N/A 08/24/2014   Procedure: ESOPHAGOGASTRODUODENOSCOPY (EGD);   Surgeon: Jerene Bears, MD;  Location: [facility];  Service: Endoscopy;  Laterality: N/A;  . LEFT HEART CATH AND CORONARY ANGIOGRAPHY N/A 01/20/2017   Procedure: Left Heart Cath and Coronary Angiography;  Surgeon: Sherren Mocha, MD;  Location: [facility];  Service: Cardiovascular;  Laterality: N/A;  . TEE WITHOUT CARDIOVERSION N/A 01/22/2017   Procedure: TRANSESOPHAGEAL ECHOCARDIOGRAM (TEE);  Surgeon: Ivin Poot, MD;  Location: [facility];  Service: Open Heart Surgery;  Laterality: N/A;    Current Outpatient Medications  Medication Sig Dispense Refill  . aspirin EC 81 MG tablet Take 1 tablet (81 mg total) by mouth daily. 90 tablet 3  . atorvastatin (LIPITOR) 80 MG tablet Take 1 tablet (80 mg total) by mouth daily. 90 tablet 3  . Omega 3 1200 MG CAPS Take by mouth.    [name] OVER THE COUNTER MEDICATION Take 3 capsules daily by mouth.     [name] OVER THE COUNTER MEDICATION Take 1 Scoop daily by mouth. Vital Reds Powder mix with water    . tadalafil (CIALIS) 5 MG tablet Take 1 tablet PO every 3 days as needed. Do not take nitroglycerin for 3 days after Cialis dose. 10 tablet 3  . metoprolol succinate (TOPROL-XL) 25 MG 24 hr tablet Take 1 tablet (25 mg total) by mouth daily. Take with or immediately following a meal. 90 tablet 3  . pantoprazole (PROTONIX) 40 MG tablet Take 1 tablet (40 mg total) by mouth daily as needed. (Patient not taking: Reported on 03/31/2018) 90 tablet 0  No current facility-administered medications for this visit.     Allergies:   Ibuprofen   Social History:  The patient  reports that he has never smoked. He has never used smokeless tobacco. He reports that he does not drink alcohol or use drugs.   Family History:  The patient's  family history includes Diabetes in his father; Esophageal cancer in his paternal grandmother; Heart disease in his brother, father, and sister; Liver cancer in his maternal grandmother.    ROS:  Please see the history of present illness.   Otherwise, review of systems is positive for left side abdominal discomfort (1 episode).  All other systems are reviewed and negative.    PHYSICAL EXAM: VS:  BP 122/68   Pulse (!) 55   Ht 5\' 10"  (1.778 m)   Wt 239 lb (108.4 kg)   SpO2 97%   BMI 34.29 kg/m  , BMI Body mass index is 34.29 kg/m. GEN: Well nourished, well developed, in no acute distress  HEENT: normal  Neck: no JVD, no masses. No carotid bruits Cardiac: RRR without murmur or gallop                Respiratory:  clear to auscultation bilaterally, normal work of breathing GI: soft, nontender, nondistended, + BS MS: no deformity or atrophy  Ext: no pretibial edema, pedal pulses 2+= bilaterally Skin: warm and dry, no rash Neuro:  Strength and sensation are intact Psych: euthymic mood, full affect  EKG:  EKG is not ordered today.  Recent Labs: 08/24/2017: ALT 10; BUN 25; Creatinine, Ser 0.92; Hemoglobin 14.7; Platelets 184.0; Potassium 4.7; Sodium 140; TSH 0.92   Lipid Panel     Component Value Date/Time   CHOL 155 08/24/2017 1401   CHOL 142 02/25/2017 0728   TRIG 84.0 08/24/2017 1401   HDL 54.30 08/24/2017 1401   HDL 42 02/25/2017 0728   CHOLHDL 3 08/24/2017 1401   VLDL 16.8 08/24/2017 1401   LDLCALC 84 08/24/2017 1401   LDLCALC 90 02/25/2017 0728   LDLDIRECT 161.3 10/03/2008 0926      Wt Readings from Last 3 Encounters:  03/31/18 239 lb (108.4 kg)  12/03/17 237 lb 9.6 oz (107.8 kg)  10/08/17 226 lb 12.8 oz (102.9 kg)     ASSESSMENT AND PLAN: 1.  Coronary artery disease, native vessel, without angina: The patient is now  over 1 year out from multivessel CABG.  His medical program is reviewed and will be continued.  This includes antiplatelet therapy with aspirin 81 mg and a high intensity statin drug. Will change metoprolol tartrate to metoprolol succinate because he is frequently forgetting the second dose.  2.  Hyperlipidemia: Treated with atorvastatin 80 mg.  We discussed lifestyle modification today at  length.  His weight is up 15 pounds from 6 months ago.  He is exercising regularly but admits he has to cut back on portions.  Feels like he is eating healthy foods but just eating too much of them.  Current medicines are reviewed with the patient today.  The patient does not have concerns regarding medicines.  Labs/ tests ordered today include:   Orders Placed This Encounter  Procedures  . Lipid panel    Disposition:   FU one year  Signed, Sherren Mocha, MD  03/31/2018 9:22 AM    [facility] [address], [place], [place]  [address] Phone: [phone]; Fax: [phone]

## 2018-03-31 NOTE — Patient Instructions (Signed)
Medication Instructions:  1) STOP LOPRESSOR (Metoprolol tartrate) 2) START TOPROL XL (Metoprolol Succinate) 25 mg daily  Labwork: TODAY: Lipids  Testing/Procedures: None  Follow-Up: Your provider wants you to follow-up in: 1 year with Dr. Burt Knack. You will receive a reminder letter in the mail two months in advance. If you don't receive a letter, please call our office to schedule the follow-up appointment.    Any Other Special Instructions Will Be Listed Below (If Applicable).     If you need a refill on your cardiac medications before your next appointment, please call your pharmacy.

## 2018-04-05 IMAGING — CR DG CHEST 2V
2 series · 2 of 2 positions shown · non-contrast
Comparison: None.

CLINICAL DATA: 61-year-old male with left mid chest pain for 2
days. Recent flu diagnosis with some cough. Initial encounter.

EXAM:
CHEST  2 VIEW

[w chest pa]
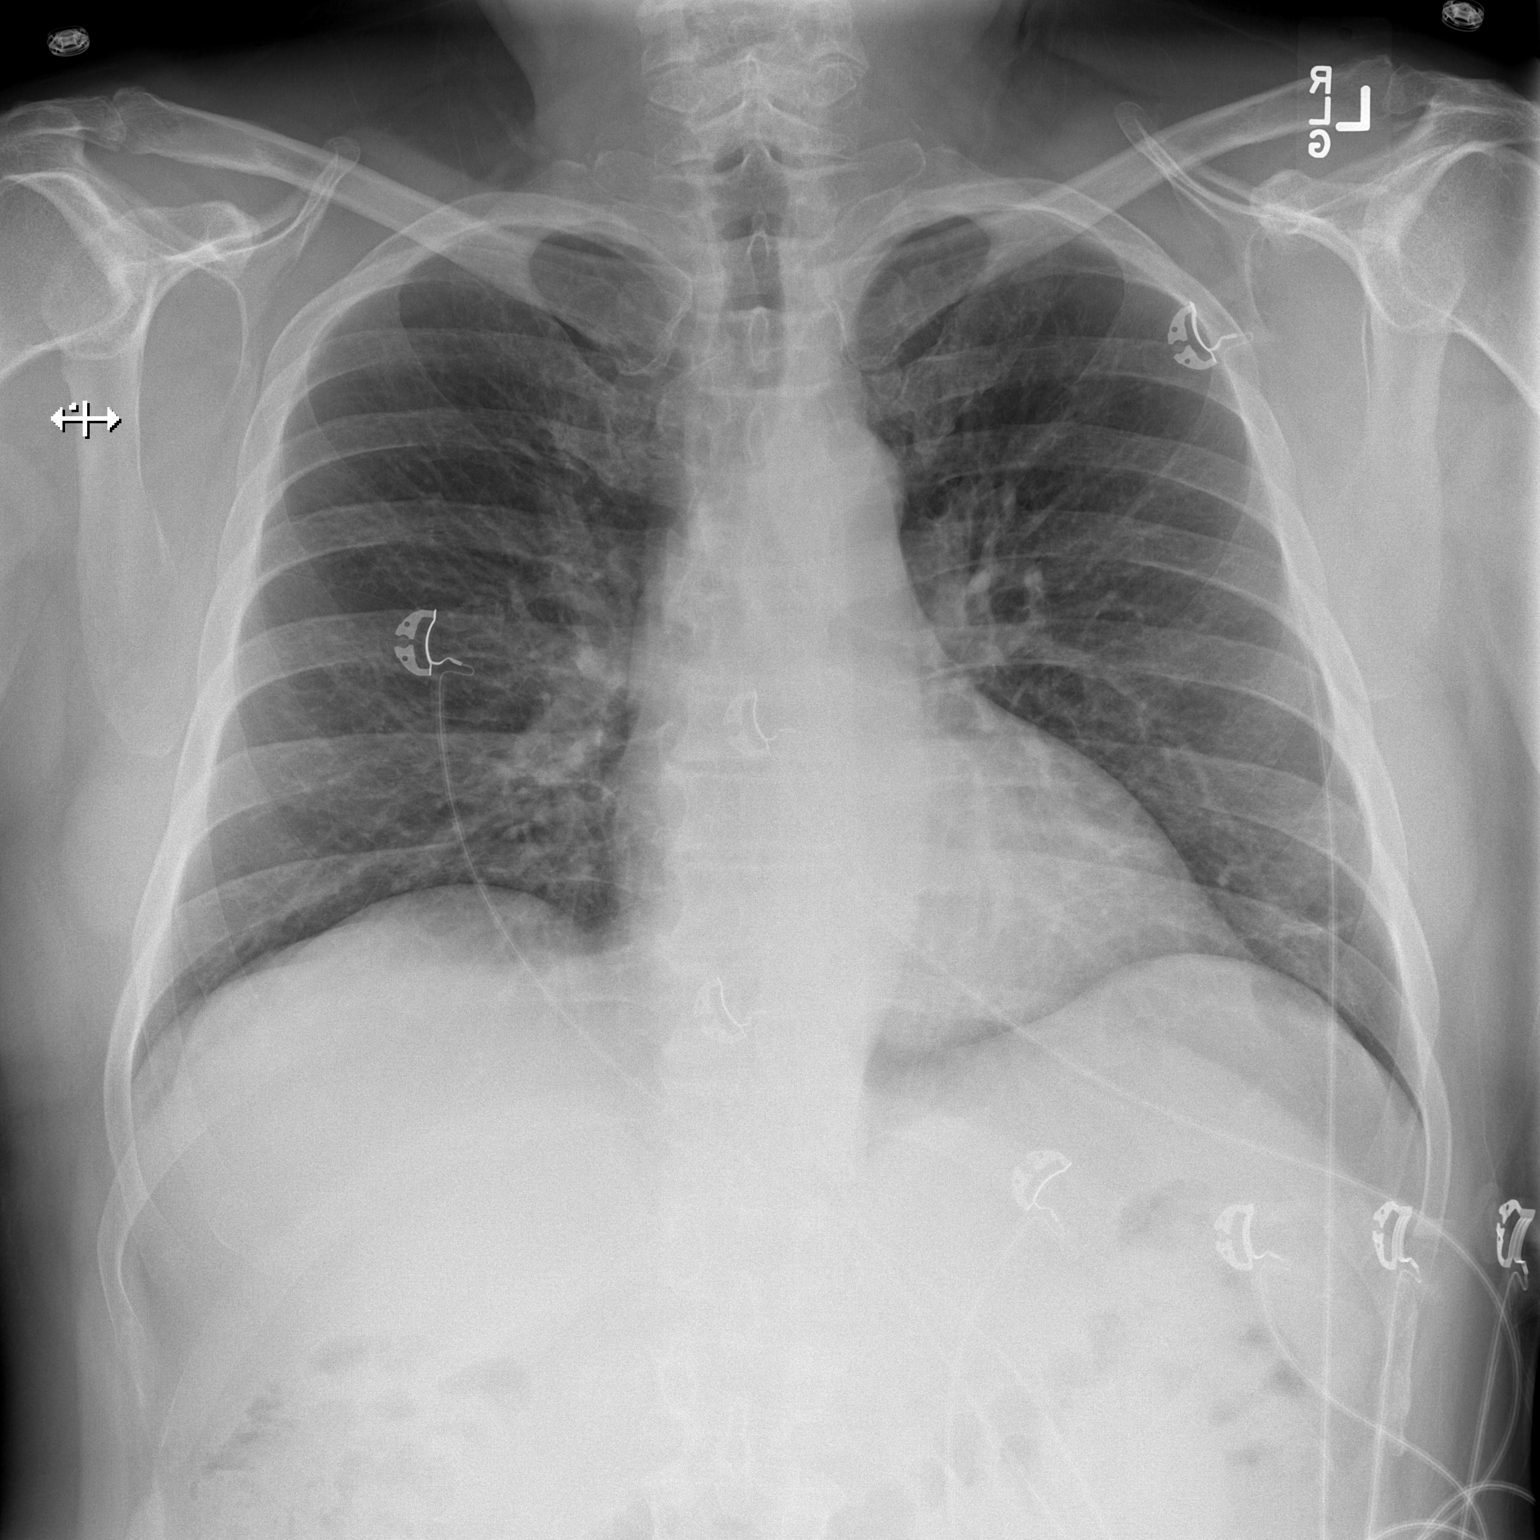

[w chest lat]
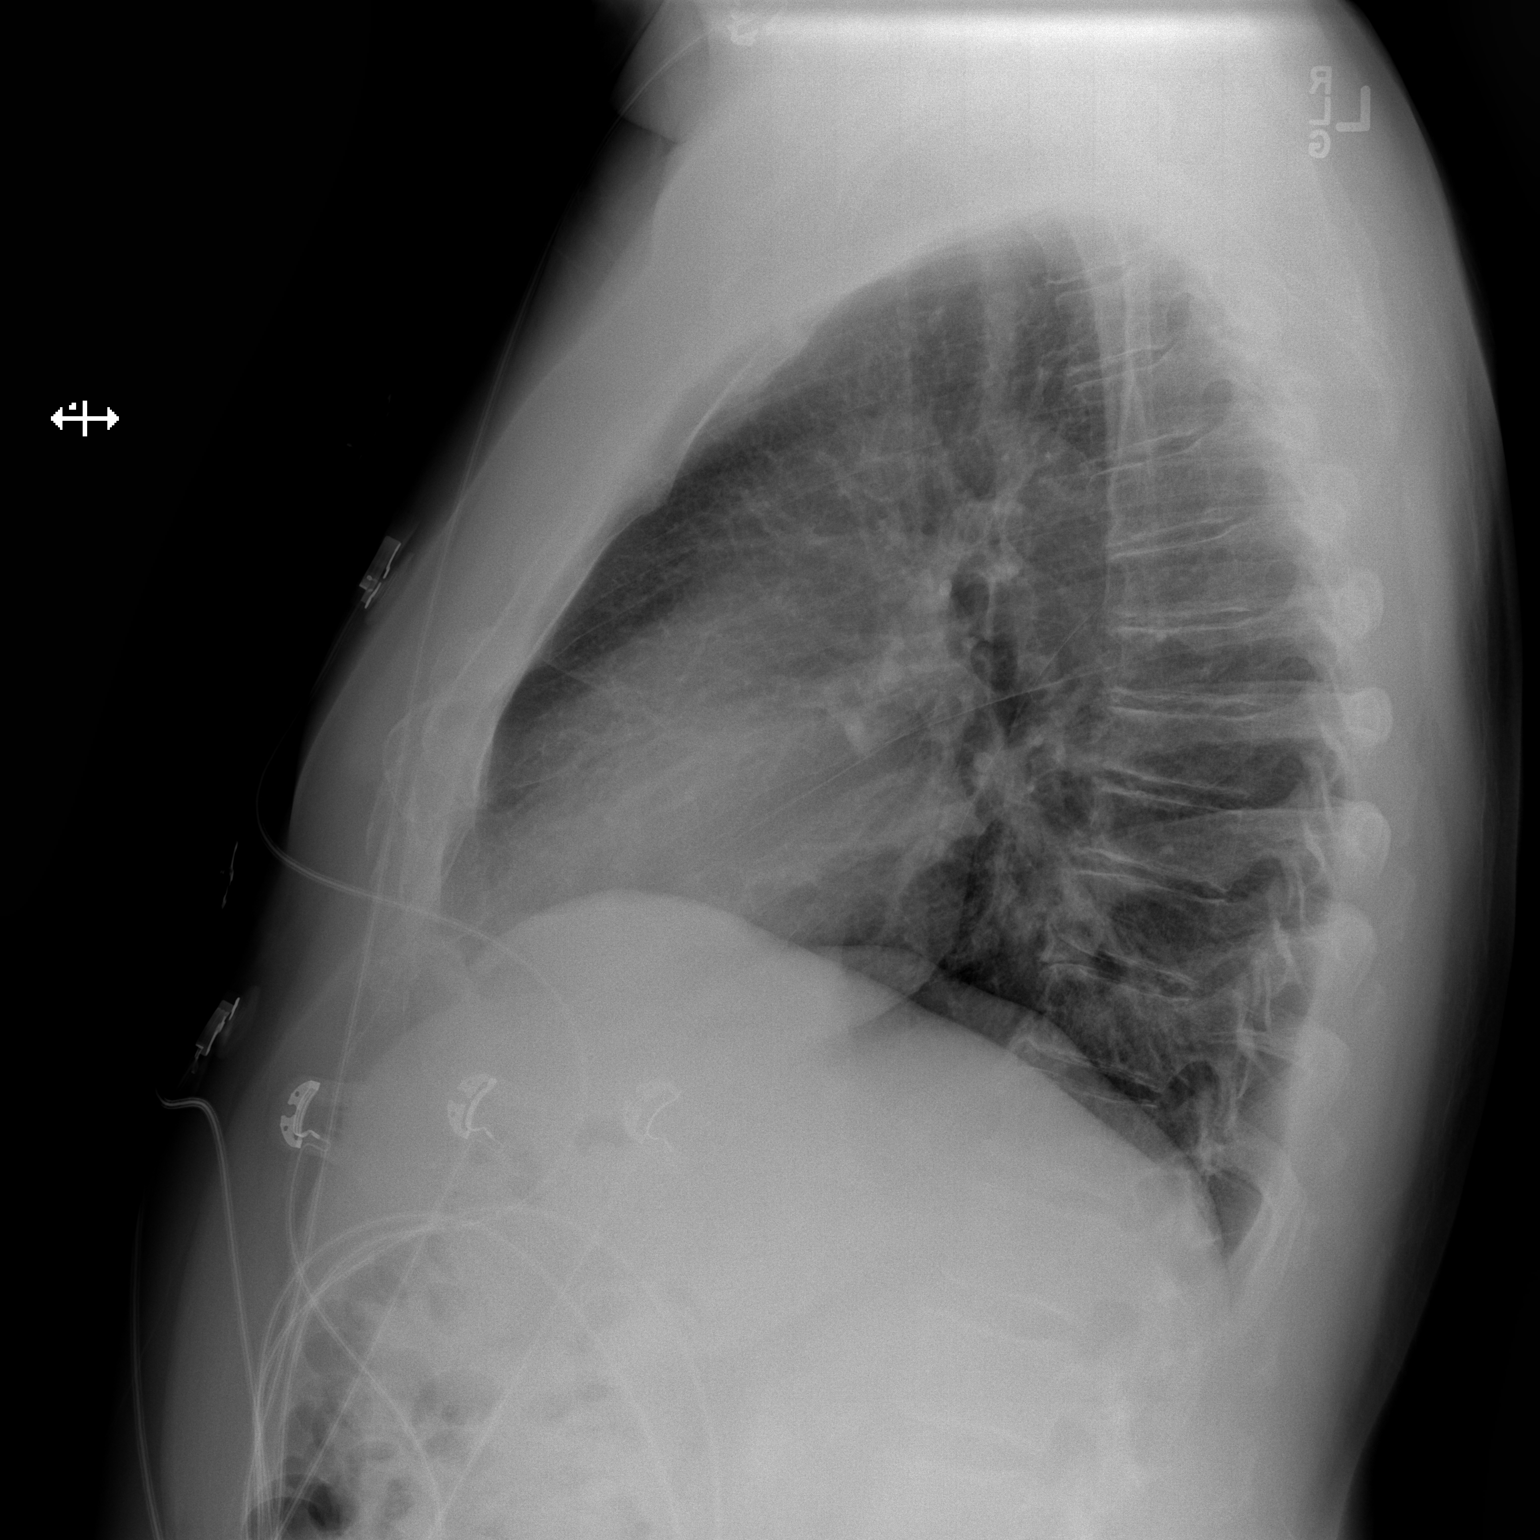

[2 of 2 positions shown; findings below may reference images not displayed]

FINDINGS: Mildly low lung volumes. Normal cardiac size and mediastinal
contours. Visualized tracheal air column is within normal limits. No
pneumothorax, pulmonary edema, pleural effusion or consolidation.
There is mild linear opacity at the left lung base. No other
confluent pulmonary opacity. No acute osseous abnormality
identified. Negative visible bowel gas pattern.
IMPRESSION: Mild linear opacity at the left lung base most resembles
atelectasis. No other acute cardiopulmonary abnormality.

## 2018-04-07 IMAGING — CR DG CHEST 1V PORT
1 series · 1 of 1 positions shown · non-contrast
Comparison: 01/20/2017

CLINICAL DATA: Post CABG 01/20/2017

EXAM:
PORTABLE CHEST 1 VIEW

[AP]
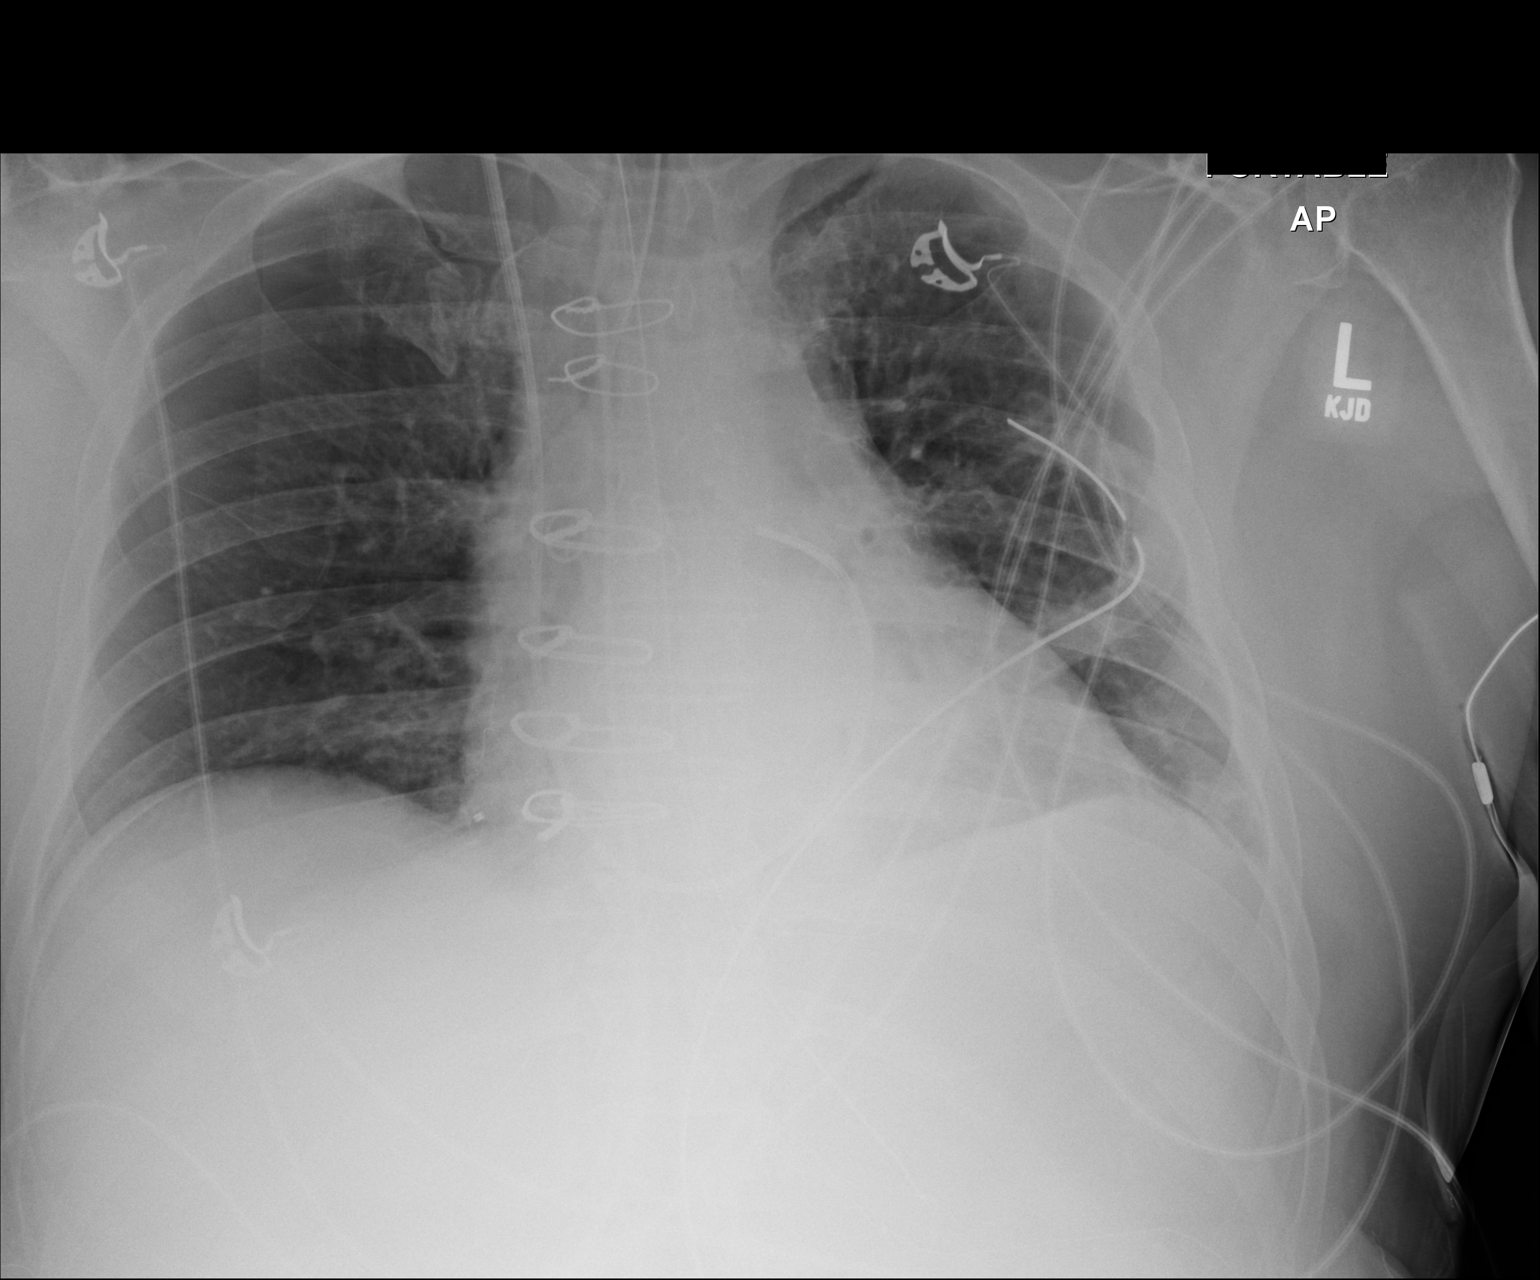

[1 of 1 positions shown; findings below may reference images not displayed]

FINDINGS: Borderline cardiomegaly. Status post CABG. Endotracheal tube in
place with tip 3 cm above the carina. There is right IJ Swan-Ganz
catheter with tip in the region of main pulmonary artery. Left side
chest tube in place. Mild left basilar atelectasis. There is no
pneumothorax. NG tube in place with tip in proximal stomach. No
pulmonary edema.
IMPRESSION: Status post CABG. Endotracheal tube in place with tip 3 cm above the
carina. There is right IJ Swan-Ganz catheter with tip in the region
of main pulmonary artery. Left side chest tube in place. Mild left
basilar atelectasis. There is no pneumothorax. NG tube in place with
tip in proximal stomach.

## 2018-04-13 ENCOUNTER — Encounter: Payer: Self-pay | Admitting: Cardiovascular Disease

## 2018-04-20 ENCOUNTER — Encounter: Payer: Self-pay | Admitting: Cardiovascular Disease

## 2018-04-21 DIAGNOSIS — G4733 Obstructive sleep apnea (adult) (pediatric): Secondary | ICD-10-CM | POA: Diagnosis not present

## 2018-04-23 ENCOUNTER — Encounter: Payer: Self-pay | Admitting: Internal Medicine

## 2018-04-23 ENCOUNTER — Ambulatory Visit (INDEPENDENT_AMBULATORY_CARE_PROVIDER_SITE_OTHER): Payer: BLUE CROSS/BLUE SHIELD | Admitting: Internal Medicine

## 2018-04-23 VITALS — BP 110/74 | HR 76 | Ht 70.0 in | Wt 237.8 lb

## 2018-04-23 DIAGNOSIS — I2510 Atherosclerotic heart disease of native coronary artery without angina pectoris: Secondary | ICD-10-CM | POA: Diagnosis not present

## 2018-04-23 DIAGNOSIS — G4733 Obstructive sleep apnea (adult) (pediatric): Secondary | ICD-10-CM

## 2018-04-23 NOTE — Progress Notes (Signed)
HPI male never smoker followed for OSA, complicated by  duodenal ulcer, UGI bleed, GERD, MI/CAD/CABG x4 HST-09/01/17-AHI 61.5/hour, desaturation to 74%, body weight 224 pounds  ----------------------------------------------------------------------------------- 12/03/17-62 year old male never smoker followed for OSA, complicated by  duodenal ulcer, UGI bleed, GERD, MI/CAD/CABG x4 HST-09/01/17-AHI 61.5/hour, desaturation to 74%, body weight 224 pounds CPAP auto 5-20/[facility]   [facility] had not put him into AirView as requested Patient is requesting change DME ----OSA; DME [facility]. Pt wears CPAP nightly  CPAP download 90% compliance, AHI 8.2/hour with residual events mostly centrals.  He is comfortable with current pressures, feels that he is sleeping well, and his nasal mask seems to be providing good seal.  04/23/2018- 62 year old male never smoker followed for OSA, complicated by  duodenal ulcer, UGI bleed, GERD, MI/CAD/CABG x4 CPAP auto 5-20/[facility] ----OSA: DME: [facility]. Pt wears CPAP nightly for 8 hours. Pressure works well. DL attached.  Download 100% compliance AHI 5.0/hour. He is doing very well, sleeping well and feeling better with CPAP.  Using a So-Clean machine, small nasal mask.  No specific concerns.  ROS-see HPI   + = positive Constitutional:    weight loss, night sweats, fevers, chills, fatigue, lassitude. HEENT:    headaches, difficulty swallowing, tooth/dental problems, sore throat,       sneezing, itching, ear ache, nasal congestion, post nasal drip, snoring CV:    chest pain, orthopnea, PND, swelling in lower extremities, anasarca,                                                        dizziness, palpitations Resp:   shortness of breath with exertion or at rest.                productive cough,   non-productive cough, coughing up of blood.              change in color of mucus.  wheezing.   Skin:    rash or lesions. GI:  No-   heartburn, indigestion, abdominal pain, nausea, vomiting,  diarrhea,                 change in bowel habits, loss of appetite GU: dysuria, change in color of urine, no urgency or frequency.   flank pain. MS:   joint pain, stiffness, decreased range of motion, back pain. Neuro-     nothing unusual Psych:  change in mood or affect.  depression or anxiety.   memory loss.  OBJ- Physical Exam General- Alert, Oriented, Affect-appropriate, Distress- none acute, + Overweight Skin- rash-none, lesions- none, excoriation- none Lymphadenopathy- none Head- atraumatic            Eyes- Gross vision intact, PERRLA, conjunctivae and secretions clear            Ears- Hearing, canals-normal            Nose- Clear, Septal dev +,  No-mucus, polyps, erosion, perforation             Throat- Mallampati II-III , mucosa clear , drainage- none, tonsils- atrophic Neck- flexible , trachea midline, no stridor , thyroid nl, carotid no bruit Chest - symmetrical excursion , unlabored           Heart/CV- RRR , no murmur , no gallop  , no rub, nl s1 s2                           -  JVD- none , edema- none, stasis changes- none, varices- none           Lung- clear to P&A, wheeze- none, cough- none , dullness-none, rub- none           Chest wall-  Abd-  Br/ Gen/ Rectal- Not done, not indicated Extrem- cyanosis- none, clubbing, none, atrophy- none, strength- nl Neuro- grossly intact to observation

## 2018-04-23 NOTE — Assessment & Plan Note (Signed)
Good compliance and control, confirming his report of benefit with improved sleep quality using CPAP. Plan-continue CPAP auto 5-20

## 2018-04-23 NOTE — Patient Instructions (Signed)
We can continue CPAP auto 5-20, mask of choice, humidifier, supplies, AirView  Please call if we can help 

## 2018-04-23 NOTE — Assessment & Plan Note (Signed)
He is being actively followed by cardiology for his cardiovascular problems with no acute events reported for this visit.

## 2018-05-09 IMAGING — CR DG CHEST 2V
2 series · 2 of 2 positions shown · non-contrast
Comparison: PA and lateral chest x-ray January 25, 2017

CLINICAL DATA: Status post CABG on January 22, 2017 with no current
complaints.

EXAM:
CHEST  2 VIEW

[w chest pa]
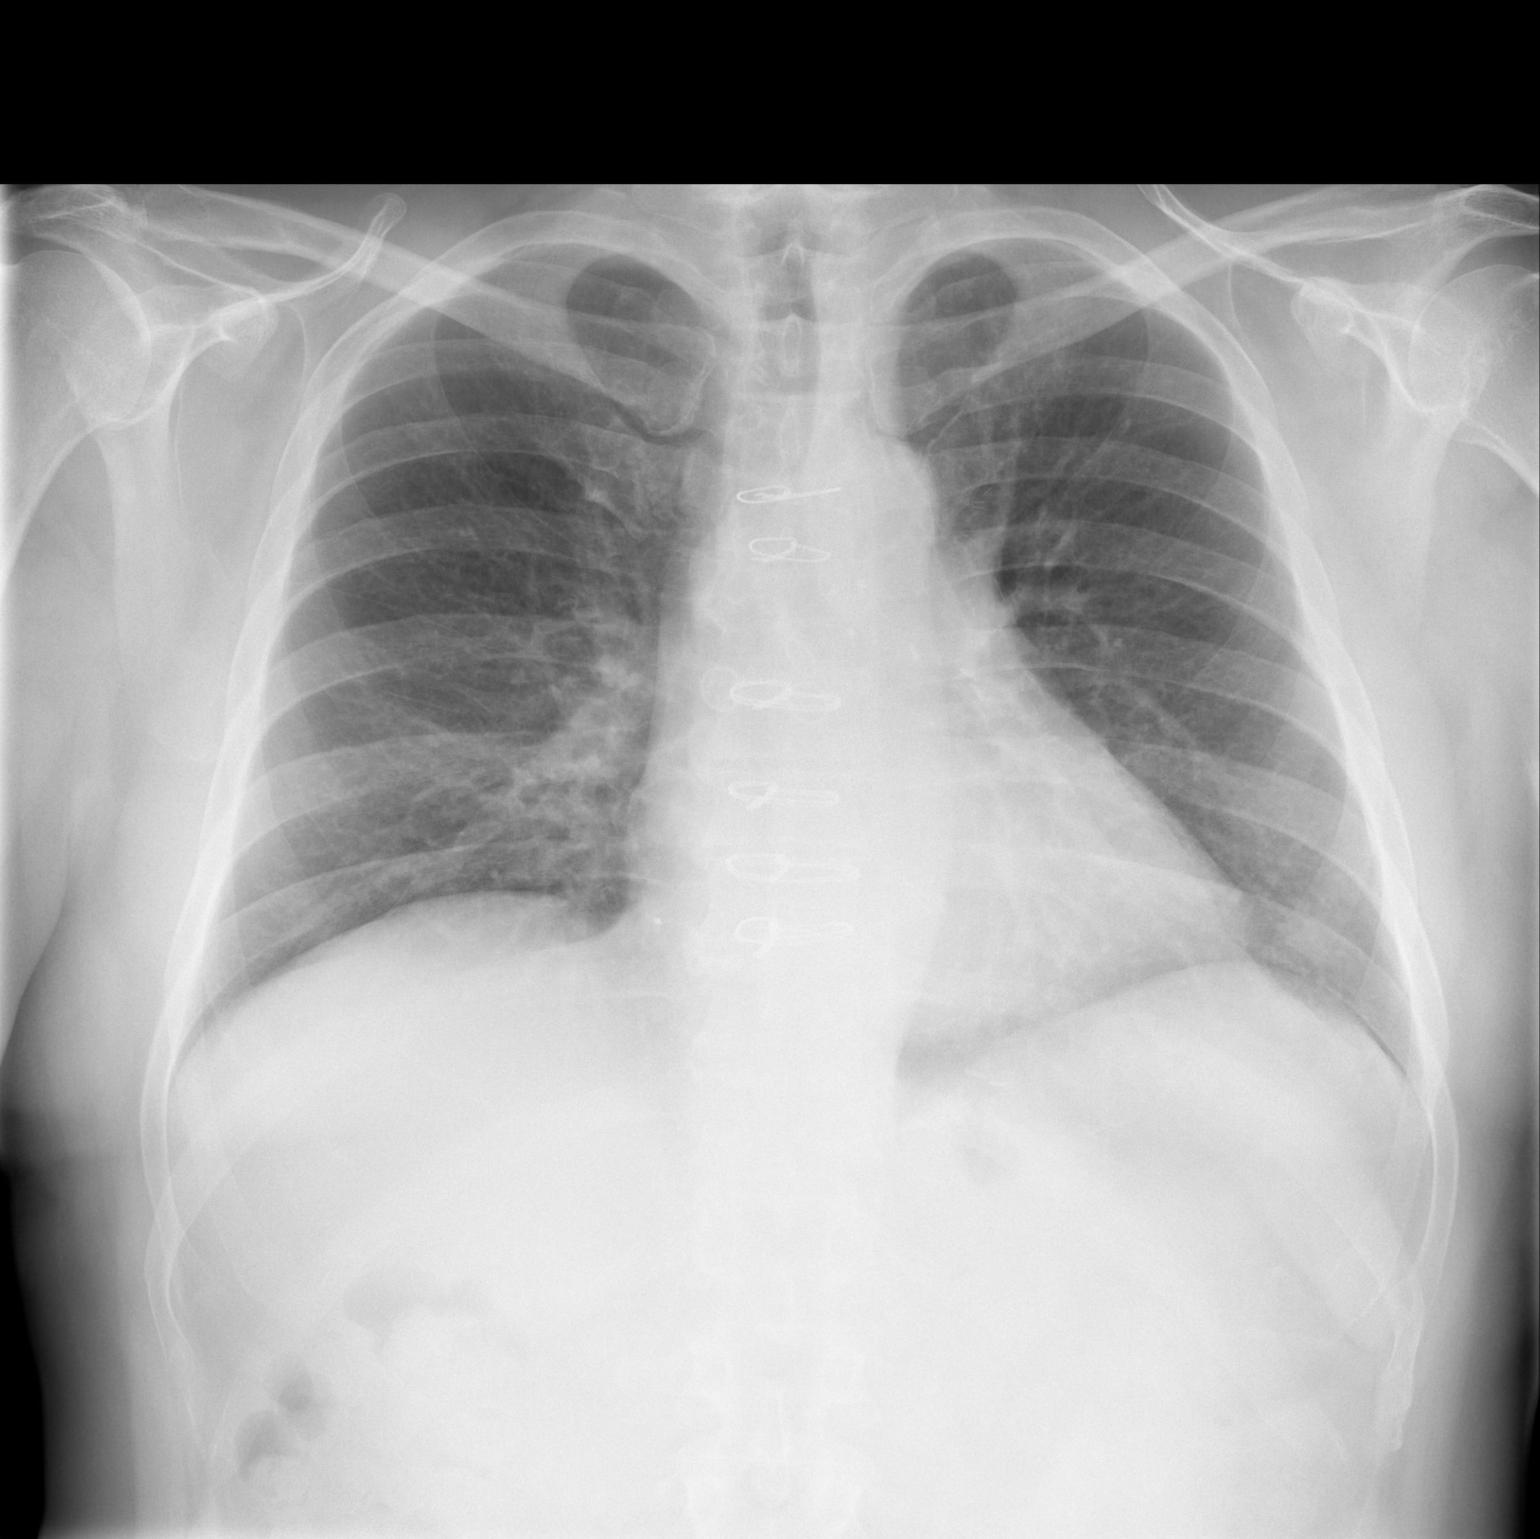

[w chest lat]
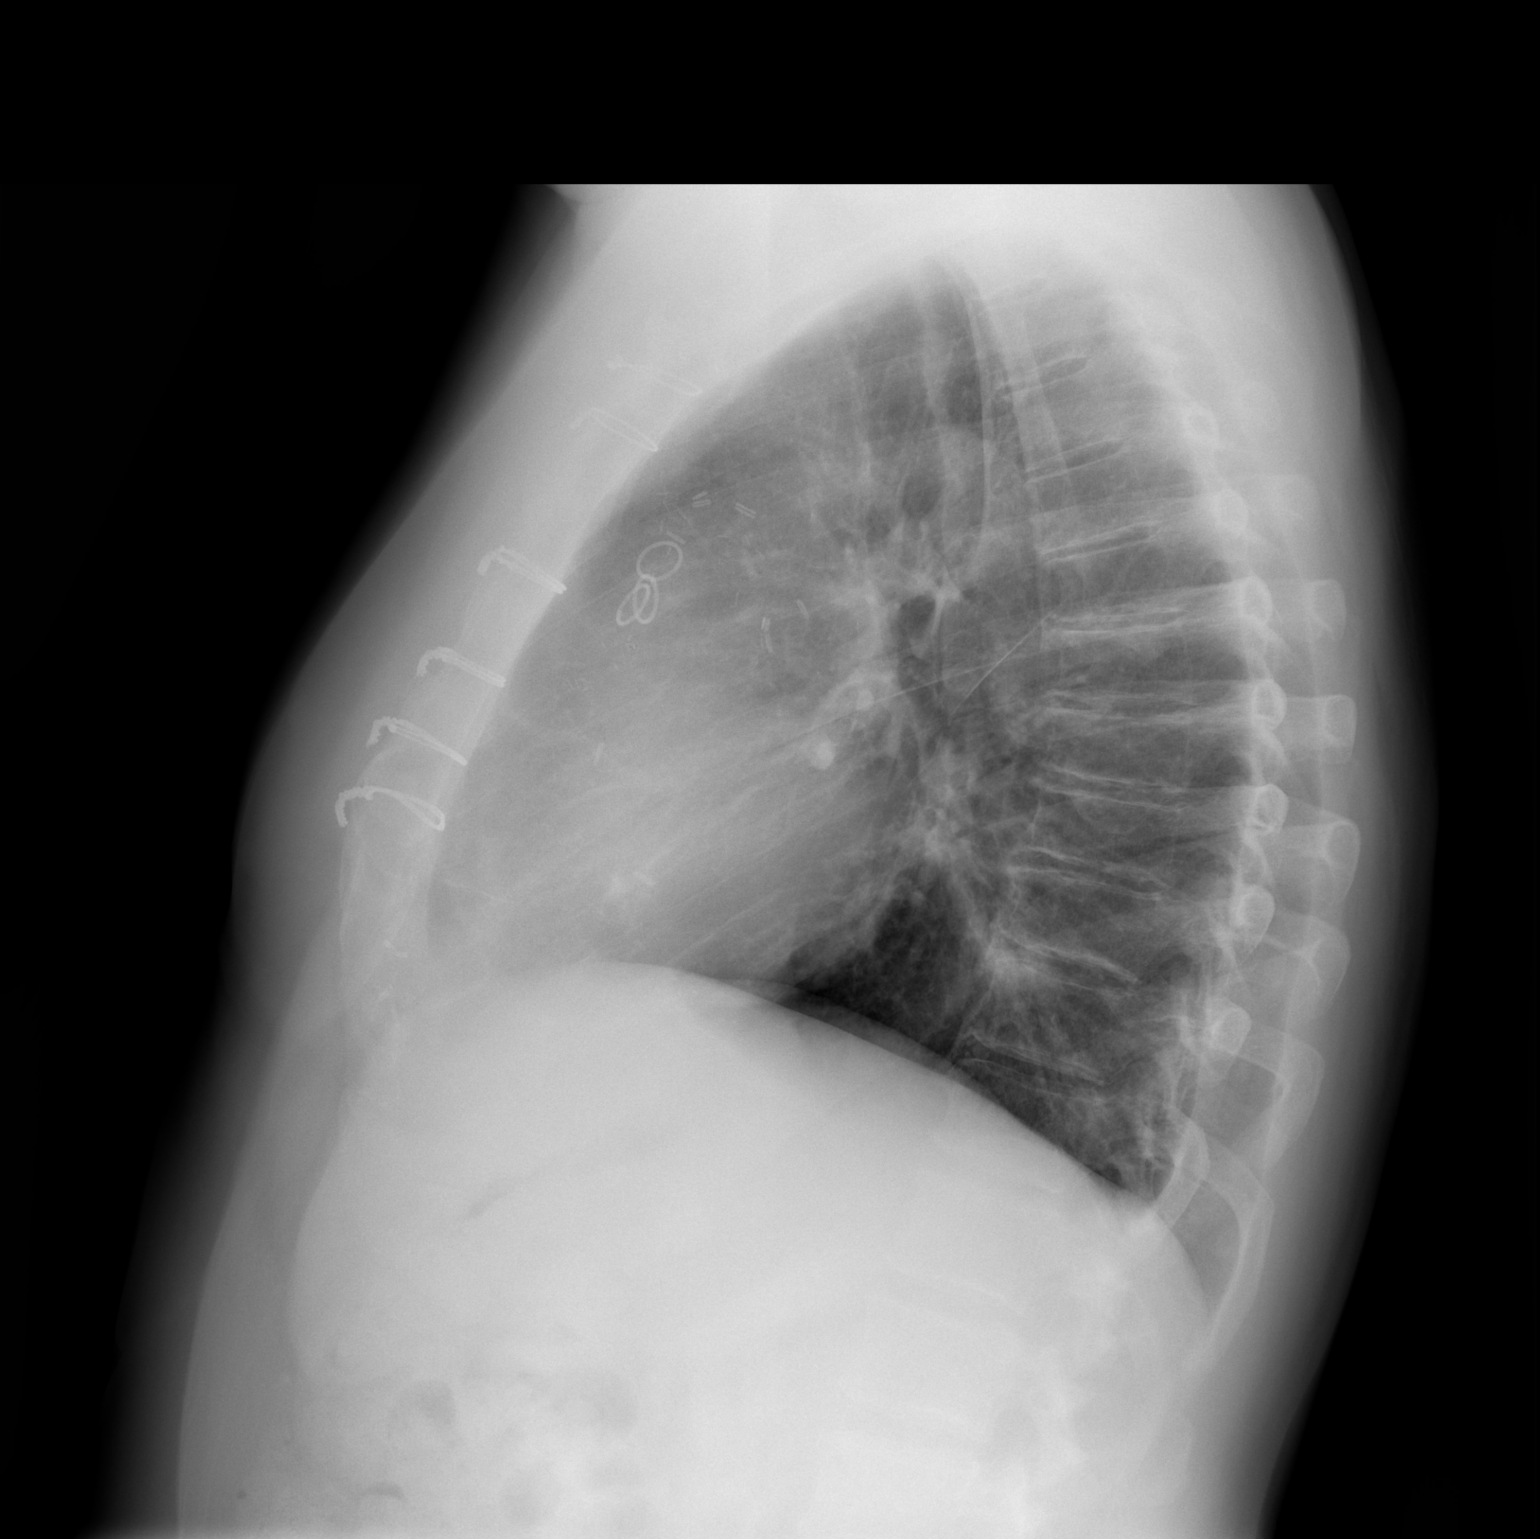

[2 of 2 positions shown; findings below may reference images not displayed]

FINDINGS: The lungs are adequately inflated. The right-sided pneumothorax has
resolved. The bilateral pleural effusions have cleared. There is no
significant atelectasis. The heart and pulmonary vascularity are
normal. The sternal wires are intact. The mediastinum is normal in
width. The bony thorax exhibits no acute abnormality.
IMPRESSION: Interval resolution of bilateral pleural effusions and the
right-sided pneumothorax. No CHF nor other acute cardiopulmonary
abnormality.

## 2018-06-05 ENCOUNTER — Other Ambulatory Visit: Payer: Self-pay | Admitting: Cardiology

## 2018-06-07 NOTE — Telephone Encounter (Signed)
Pt's pharmacy is requesting a refill on tadalafil. Would Dr. Meda Coffee like to refill this medication? Please address

## 2018-07-22 DIAGNOSIS — G4733 Obstructive sleep apnea (adult) (pediatric): Secondary | ICD-10-CM | POA: Diagnosis not present

## 2018-07-22 NOTE — Telephone Encounter (Signed)
Called patient to discuss BP and symptoms. Left message for patient to stay hydrated and to eat a salty snack if his BP is still low (although not much different from baseline). Instructed patient to call back tomorrow to speak with a nurse.

## 2018-07-23 ENCOUNTER — Telehealth: Payer: Self-pay

## 2018-07-23 NOTE — Telephone Encounter (Signed)
Spoke with the patient in regards to his BP and Katy's message.  The patient is feeling fine today and will continue to monitor his BP and will call us with concerns.

## 2018-09-09 ENCOUNTER — Other Ambulatory Visit: Payer: Self-pay | Admitting: Internal Medicine

## 2018-10-04 ENCOUNTER — Other Ambulatory Visit: Payer: Self-pay | Admitting: Internal Medicine

## 2018-10-04 ENCOUNTER — Other Ambulatory Visit: Payer: Self-pay | Admitting: Cardiology

## 2018-10-04 MED ORDER — ATORVASTATIN CALCIUM 80 MG PO TABS: 80.0000 mg | ORAL_TABLET | Freq: Every day | ORAL | 0 refills | Status: DC

## 2018-10-04 NOTE — Telephone Encounter (Signed)
30 day courtesy refill given until appt on 10/28/18

## 2018-10-04 NOTE — Telephone Encounter (Signed)
Okay to refill? Please advise. Thanks, MI 

## 2018-10-04 NOTE — Telephone Encounter (Signed)
Copied from [place] [phone] [phone]. Topic: Quick Communication - Rx Refill/Question >> Oct 04, 2018  1:16 PM Leward Quan A wrote: Medication: atorvastatin (LIPITOR) 80 MG tablet Patient have 1 tablet left  Has the patient contacted their pharmacy? Yes.     Preferred Pharmacy (with phone number or street name): [id number] - [place], [place] - [address] [phone] (Phone) [phone] (Fax)    Agent: Please be advised that RX refills may take up to 3 business days. We ask that you follow-up with your pharmacy.

## 2018-10-11 DIAGNOSIS — H2513 Age-related nuclear cataract, bilateral: Secondary | ICD-10-CM | POA: Diagnosis not present

## 2018-10-11 DIAGNOSIS — H524 Presbyopia: Secondary | ICD-10-CM | POA: Diagnosis not present

## 2018-10-11 DIAGNOSIS — H11001 Unspecified pterygium of right eye: Secondary | ICD-10-CM | POA: Diagnosis not present

## 2018-10-12 ENCOUNTER — Other Ambulatory Visit (INDEPENDENT_AMBULATORY_CARE_PROVIDER_SITE_OTHER): Payer: BLUE CROSS/BLUE SHIELD

## 2018-10-12 ENCOUNTER — Other Ambulatory Visit: Payer: Self-pay | Admitting: Internal Medicine

## 2018-10-12 DIAGNOSIS — Z0000 Encounter for general adult medical examination without abnormal findings: Secondary | ICD-10-CM | POA: Diagnosis not present

## 2018-10-12 DIAGNOSIS — R739 Hyperglycemia, unspecified: Secondary | ICD-10-CM

## 2018-10-12 DIAGNOSIS — E785 Hyperlipidemia, unspecified: Secondary | ICD-10-CM

## 2018-10-12 LAB — CBC WITH DIFFERENTIAL/PLATELET
BASOS ABS: 0 10*3/uL (ref 0.0–0.1)
BASOS PCT: 0.7 % (ref 0.0–3.0)
Eosinophils Absolute: 0.6 10*3/uL (ref 0.0–0.7)
Eosinophils Relative: 10 % — ABNORMAL HIGH (ref 0.0–5.0)
HEMATOCRIT: 44.2 % (ref 39.0–52.0)
Hemoglobin: 15.2 g/dL (ref 13.0–17.0)
LYMPHS PCT: 32 % (ref 12.0–46.0)
Lymphs Abs: 1.9 10*3/uL (ref 0.7–4.0)
MCHC: 34.3 g/dL (ref 30.0–36.0)
MCV: 90.9 fl (ref 78.0–100.0)
MONOS PCT: 11.4 % (ref 3.0–12.0)
Monocytes Absolute: 0.7 10*3/uL (ref 0.1–1.0)
NEUTROS ABS: 2.7 10*3/uL (ref 1.4–7.7)
Neutrophils Relative %: 45.9 % (ref 43.0–77.0)
PLATELETS: 130 10*3/uL — AB (ref 150.0–400.0)
RBC: 4.87 Mil/uL (ref 4.22–5.81)
RDW: 13.3 % (ref 11.5–15.5)
WBC: 5.8 10*3/uL (ref 4.0–10.5)

## 2018-10-12 LAB — URINALYSIS, ROUTINE W REFLEX MICROSCOPIC
Bilirubin Urine: NEGATIVE
Hgb urine dipstick: NEGATIVE
Ketones, ur: NEGATIVE
Leukocytes, UA: NEGATIVE
Nitrite: NEGATIVE
RBC / HPF: NONE SEEN (ref 0–?)
Specific Gravity, Urine: >=1.03 — AB (ref 1.000–1.030)
TOTAL PROTEIN, URINE-UPE24: NEGATIVE
Urine Glucose: NEGATIVE
Urobilinogen, UA: 0.2 (ref 0.0–1.0)
pH: 5.5 (ref 5.0–8.0)

## 2018-10-12 LAB — BASIC METABOLIC PANEL
BUN: 23 mg/dL (ref 6–23)
CHLORIDE: 104 meq/L (ref 96–112)
CO2: 28 mEq/L (ref 19–32)
CREATININE: 1.06 mg/dL (ref 0.40–1.50)
Calcium: 9.5 mg/dL (ref 8.4–10.5)
GFR: 75.55 mL/min (ref >60.00)
Glucose, Bld: 109 mg/dL — ABNORMAL HIGH (ref 70–99)
Potassium: 4.8 mEq/L (ref 3.5–5.1)
Sodium: 139 mEq/L (ref 135–145)

## 2018-10-12 LAB — LIPID PANEL
CHOL/HDL RATIO: 3
Cholesterol: 134 mg/dL (ref 0–200)
HDL: 44.4 mg/dL (ref >39.00)
LDL CALC: 81 mg/dL (ref 0–99)
NonHDL: 89.44
Triglycerides: 44 mg/dL (ref 0.0–149.0)
VLDL: 8.8 mg/dL (ref 0.0–40.0)

## 2018-10-12 LAB — HEPATIC FUNCTION PANEL
ALBUMIN: 4.2 g/dL (ref 3.5–5.2)
ALK PHOS: 51 U/L (ref 39–117)
ALT: 20 U/L (ref 0–53)
AST: 20 U/L (ref 0–37)
Bilirubin, Direct: 0.3 mg/dL (ref 0.0–0.3)
TOTAL PROTEIN: 6.7 g/dL (ref 6.0–8.3)
Total Bilirubin: 1.8 mg/dL — ABNORMAL HIGH (ref 0.2–1.2)

## 2018-10-12 LAB — TSH: TSH: 1.17 u[IU]/mL (ref 0.35–4.50)

## 2018-10-12 LAB — PSA: PSA: 0.44 ng/mL (ref 0.10–4.00)

## 2018-10-12 LAB — HEMOGLOBIN A1C: HEMOGLOBIN A1C: 5.8 % (ref 4.6–6.5)

## 2018-10-24 DIAGNOSIS — G4733 Obstructive sleep apnea (adult) (pediatric): Secondary | ICD-10-CM | POA: Diagnosis not present

## 2018-10-28 ENCOUNTER — Encounter: Payer: Self-pay | Admitting: Internal Medicine

## 2018-10-28 ENCOUNTER — Ambulatory Visit (INDEPENDENT_AMBULATORY_CARE_PROVIDER_SITE_OTHER): Payer: BLUE CROSS/BLUE SHIELD | Admitting: Internal Medicine

## 2018-10-28 VITALS — BP 112/68 | HR 92 | Temp 97.6°F | Ht 70.0 in | Wt 242.0 lb

## 2018-10-28 DIAGNOSIS — R739 Hyperglycemia, unspecified: Secondary | ICD-10-CM

## 2018-10-28 DIAGNOSIS — Z23 Encounter for immunization: Secondary | ICD-10-CM

## 2018-10-28 DIAGNOSIS — Z1159 Encounter for screening for other viral diseases: Secondary | ICD-10-CM

## 2018-10-28 DIAGNOSIS — Z0000 Encounter for general adult medical examination without abnormal findings: Secondary | ICD-10-CM

## 2018-10-28 NOTE — Assessment & Plan Note (Signed)

## 2018-10-28 NOTE — Assessment & Plan Note (Signed)
stable overall by history and exam, recent data reviewed with pt, and pt to continue medical treatment as before,  to f/u any worsening symptoms or concerns  

## 2018-10-28 NOTE — Patient Instructions (Addendum)

## 2018-10-28 NOTE — Progress Notes (Signed)
Subjective:    Patient ID: [name], male    DOB: September 25, 1956, 62 y.o.   MRN: [id number]  HPI  Here for wellness and f/u;  Overall doing ok;  Pt denies Chest pain, worsening SOB, DOE, wheezing, orthopnea, PND, worsening LE edema, palpitations, dizziness or syncope.  Pt denies neurological change such as new headache, facial or extremity weakness.  Pt denies polydipsia, polyuria, or low sugar symptoms. Pt states overall good compliance with treatment and medications, good tolerability, and has been trying to follow appropriate diet.  Pt denies worsening depressive symptoms, suicidal ideation or panic. No fever, night sweats, wt loss, loss of appetite, or other constitutional symptoms.  Pt states good ability with ADL's, has low fall risk, home safety reviewed and adequate, no other significant changes in hearing or vision, and only occasionally active with exercise.  Good compliance with CPAP, feels much improved, has appt with Dr Annamaria Boots yearly.  Wt Readings from Last 3 Encounters:  10/28/18 242 lb (109.8 kg)  04/23/18 237 lb 12.8 oz (107.9 kg)  03/31/18 239 lb (108.4 kg)  Goes to gym with cardio 3-4 times per wk.  No new complaints Past Medical History:  Diagnosis Date  . Adenomatous colon polyp   . Gastric erosions   . Gastric polyps   . Gastric ulcer   . GERD (gastroesophageal reflux disease)   . GI bleed due to NSAIDs   . HLD (hyperlipidemia)   . Non-ST elevation (NSTEMI) myocardial infarction ([facility])   . PAF (paroxysmal atrial fibrillation) ([place]) 02/10/2017  . S/P CABG x 4 01/26/2017   Past Surgical History:  Procedure Laterality Date  . CORONARY ARTERY BYPASS GRAFT N/A 01/22/2017   Procedure: CORONARY ARTERY BYPASS GRAFTING (CABG) times 4 using left internal mammary artery and right greater saphenous leg vein.;  Surgeon: Ivin Poot, MD;  Location: [facility];  Service: Open Heart Surgery;  Laterality: N/A;  . ESOPHAGOGASTRODUODENOSCOPY N/A 08/24/2014   Procedure:  ESOPHAGOGASTRODUODENOSCOPY (EGD);  Surgeon: Jerene Bears, MD;  Location: [facility];  Service: Endoscopy;  Laterality: N/A;  . LEFT HEART CATH AND CORONARY ANGIOGRAPHY N/A 01/20/2017   Procedure: Left Heart Cath and Coronary Angiography;  Surgeon: Sherren Mocha, MD;  Location: [facility];  Service: Cardiovascular;  Laterality: N/A;  . TEE WITHOUT CARDIOVERSION N/A 01/22/2017   Procedure: TRANSESOPHAGEAL ECHOCARDIOGRAM (TEE);  Surgeon: Ivin Poot, MD;  Location: [facility];  Service: Open Heart Surgery;  Laterality: N/A;    reports that he has never smoked. He has never used smokeless tobacco. He reports that he does not drink alcohol or use drugs. family history includes Diabetes in his father; Esophageal cancer in his paternal grandmother; Heart disease in his brother, father, and sister; Liver cancer in his maternal grandmother. Allergies  Allergen Reactions  . Ibuprofen Other (See Comments)    Severe GI Bleed and PUD     Current Outpatient Medications on File Prior to Visit  Medication Sig Dispense Refill  . aspirin EC 81 MG tablet Take 1 tablet (81 mg total) by mouth daily. 90 tablet 3  . atorvastatin (LIPITOR) 80 MG tablet Take 1 tablet (80 mg total) by mouth daily. 30 tablet 0  . metoprolol succinate (TOPROL-XL) 25 MG 24 hr tablet Take 1 tablet (25 mg total) by mouth daily. Take with or immediately following a meal. 90 tablet 3  . Omega 3 1200 MG CAPS Take by mouth.    [name] OVER THE COUNTER MEDICATION Take 3 capsules daily by mouth.     [name]  OVER THE COUNTER MEDICATION Take 1 Scoop daily by mouth. Vital Reds Powder mix with water    . tadalafil (CIALIS) 5 MG tablet TAKE 1 TABLET EVERY 3 DAYS AS NEEDED. DO NOT TAKE NITROGLYCERIN FOR 3 DAYS AFTER CIALIS DOSE. 10 tablet 3   No current facility-administered medications on file prior to visit.    Review of Systems Constitutional: Negative for other unusual diaphoresis, sweats, appetite or weight changes HENT: Negative for other  worsening hearing loss, ear pain, facial swelling, mouth sores or neck stiffness.   Eyes: Negative for other worsening pain, redness or other visual disturbance.  Respiratory: Negative for other stridor or swelling Cardiovascular: Negative for other palpitations or other chest pain  Gastrointestinal: Negative for worsening diarrhea or loose stools, blood in stool, distention or other pain Genitourinary: Negative for hematuria, flank pain or other change in urine volume.  Musculoskeletal: Negative for myalgias or other joint swelling.  Skin: Negative for other color change, or other wound or worsening drainage.  Neurological: Negative for other syncope or numbness. Hematological: Negative for other adenopathy or swelling Psychiatric/Behavioral: Negative for hallucinations, other worsening agitation, SI, self-injury, or new decreased concentration All other system neg per pt    Objective:   Physical Exam BP 112/68   Pulse 92   Temp 97.6 F (36.4 C) (Oral)   Ht 5\' 10"  (1.778 m)   Wt 242 lb (109.8 kg)   SpO2 95%   BMI 34.72 kg/m  VS noted,  Constitutional: Pt is oriented to person, place, and time. Appears well-developed and well-nourished, in no significant distress and comfortable Head: Normocephalic and atraumatic  Eyes: Conjunctivae and EOM are normal. Pupils are equal, round, and reactive to light Right Ear: External ear normal without discharge Left Ear: External ear normal without discharge Nose: Nose without discharge or deformity Mouth/Throat: Oropharynx is without other ulcerations and moist  Neck: Normal range of motion. Neck supple. No JVD present. No tracheal deviation present or significant neck LA or mass Cardiovascular: Normal rate, regular rhythm, normal heart sounds and intact distal pulses.   Pulmonary/Chest: WOB normal and breath sounds without rales or wheezing  Abdominal: Soft. Bowel sounds are normal. NT. No HSM  Musculoskeletal: Normal range of motion. Exhibits  no edema Lymphadenopathy: Has no other cervical adenopathy.  Neurological: Pt is alert and oriented to person, place, and time. Pt has normal reflexes. No cranial nerve deficit. Motor grossly intact, Gait intact Skin: Skin is warm and dry. No rash noted or new ulcerations Psychiatric:  Has normal mood and affect. Behavior is normal without agitation No other exam findings  Lab Results  Component Value Date   WBC 5.8 10/12/2018   HGB 15.2 10/12/2018   HCT 44.2 10/12/2018   PLT 130.0 (L) 10/12/2018   GLUCOSE 109 (H) 10/12/2018   CHOL 134 10/12/2018   TRIG 44.0 10/12/2018   HDL 44.40 10/12/2018   LDLDIRECT 161.3 10/03/2008   LDLCALC 81 10/12/2018   ALT 20 10/12/2018   AST 20 10/12/2018   NA 139 10/12/2018   K 4.8 10/12/2018   CL 104 10/12/2018   CREATININE 1.06 10/12/2018   BUN 23 10/12/2018   CO2 28 10/12/2018   TSH 1.17 10/12/2018   PSA 0.44 10/12/2018   INR 1.33 01/22/2017   HGBA1C 5.8 10/12/2018       Assessment & Plan:

## 2018-11-09 ENCOUNTER — Telehealth: Payer: Self-pay | Admitting: Internal Medicine

## 2018-11-09 MED ORDER — ATORVASTATIN CALCIUM 80 MG PO TABS: 80.0000 mg | ORAL_TABLET | Freq: Every day | ORAL | 1 refills | Status: DC

## 2018-11-09 NOTE — Telephone Encounter (Signed)
Copied from [place] [phone]. Topic: General - Other >> Nov 09, 2018  3:53 PM Keene Breath wrote: Reason for CRM: Patient called to check on the status of his refill.  Patient stated that the pharmacy said they had sent it several times to the practice.  Agent called the office and was told that the fax machine has been down since last week and they have been using a temporary #.  She stated that she was sending the request over to the provider today and apologized for the inconvenience.

## 2018-11-09 NOTE — Telephone Encounter (Signed)
Patient called requesting a refill on his atorvastatin (LIPITOR) 80 MG tablet for a #90 day supply.  Patient states that the pharmacy has been trying to contact us through fax. I informed the [facility] agent that we have most likely not gotten this request due to the main fax number being down.   Can this be sent in as soon as possible?

## 2018-12-30 NOTE — Telephone Encounter (Signed)
Pt is requesting to reduce the pressure on his CPAP machine.  Pt routinely wake Up between 2:00- 4:00 am to unhook the hose to the mask to reduce the pressure several times per night. Pt is requesting a order to be placed to [facility] as soon as possible.  CY please advise. Thank you.

## 2018-12-30 NOTE — Telephone Encounter (Signed)
Order- DME [name]- please change CPAP pressure to auto 5-15

## 2019-01-24 DIAGNOSIS — G4733 Obstructive sleep apnea (adult) (pediatric): Secondary | ICD-10-CM | POA: Diagnosis not present

## 2019-01-31 ENCOUNTER — Other Ambulatory Visit: Payer: Self-pay | Admitting: Cardiovascular Disease

## 2019-01-31 NOTE — Telephone Encounter (Signed)
Ok to fill 

## 2019-02-08 DIAGNOSIS — L570 Actinic keratosis: Secondary | ICD-10-CM | POA: Diagnosis not present

## 2019-03-14 ENCOUNTER — Other Ambulatory Visit: Payer: Self-pay | Admitting: Cardiovascular Disease

## 2019-04-05 DIAGNOSIS — T1511XA Foreign body in conjunctival sac, right eye, initial encounter: Secondary | ICD-10-CM | POA: Diagnosis not present

## 2019-04-13 ENCOUNTER — Encounter: Payer: Self-pay | Admitting: Internal Medicine

## 2019-04-14 ENCOUNTER — Telehealth: Payer: Self-pay | Admitting: Cardiovascular Disease

## 2019-04-14 ENCOUNTER — Telehealth: Payer: Self-pay | Admitting: Physician Assistant

## 2019-04-14 NOTE — Telephone Encounter (Signed)
New Message:    Patient calling stating that some one call him and he returning a call. I did not see a note.

## 2019-04-14 NOTE — Telephone Encounter (Signed)
I s/w Dr. Antionette Char nurse Gerome Sam RN to see if she called the pt, which she states she did not. RN does state that her scheduler for Dr. Antionette Char schedule may have called the pt to schedule appt. Per RN I will route this call to [facility] scheduler for Dr. Burt Knack to follow up .

## 2019-04-14 NOTE — Telephone Encounter (Signed)
Spoke with patient who confirmed all demographics. Patient has a smart phone and uses My chart. Will have vitals ready for visit, except for BP.

## 2019-04-20 NOTE — Progress Notes (Signed)
Virtual Visit via Video Note   This visit type was conducted due to national recommendations for restrictions regarding the COVID-19 Pandemic (e.g. social distancing) in an effort to limit this patient's exposure and mitigate transmission in our community.  Due to his co-morbid illnesses, this patient is at least at moderate risk for complications without adequate follow up.  This format is felt to be most appropriate for this patient at this time.  All issues noted in this document were discussed and addressed.  A limited physical exam was performed with this format.  Please refer to the patient's chart for his consent to telehealth for [facility].   Date:  04/21/2019   ID:  [name], DOB Oct 15, 1956, MRN [id number]  Patient Location: Home Provider Location: Home  PCP:  Biagio Borg, MD  Cardiologist: Dr. Burt Knack  Evaluation Performed:  Follow-Up Visit  Chief Complaint:  Yearly follow up  History of Present Illness:    [name] is a 63 y.o. male with hx of CAD s/p CABG, post op afib and HLD seen for follow up.   He presented in February 2018 with non-STEMI. He was found to have severe multivessel coronary artery disease and ultimately was treated with four-vessel CABG with a LIMA to LAD, saphenous vein graft to diagonal, saphenous vein graft to ramus intermedius, and saphenous vein graft to right PDA. His postoperative course was complicated only by atrial fibrillation and he was treated with amiodarone to maintain sinus rhythm. This was discontinued in his early outpatient follow-up.  He was doing well on cardiac stand point when last seen by Dr. Burt Knack 03/2018.   The patient walks on a treadmill for approximately 1 hour 6 days/week at level 3 without cardiac issue.  He denies chest pain, shortness of breath, palpitation, dizziness, orthopnea, PND, syncope, lower extremity edema or melena.  He has been compliant with his medication and diet.   The patient does not  have symptoms concerning for COVID-19 infection (fever, chills, cough, or new shortness of breath).    Past Medical History:  Diagnosis Date  . Adenomatous colon polyp   . Gastric erosions   . Gastric polyps   . Gastric ulcer   . GERD (gastroesophageal reflux disease)   . GI bleed due to NSAIDs   . HLD (hyperlipidemia)   . Non-ST elevation (NSTEMI) myocardial infarction ([place])   . PAF (paroxysmal atrial fibrillation) ([place]) 02/10/2017  . S/P CABG x 4 01/26/2017   Past Surgical History:  Procedure Laterality Date  . CORONARY ARTERY BYPASS GRAFT N/A 01/22/2017   Procedure: CORONARY ARTERY BYPASS GRAFTING (CABG) times 4 using left internal mammary artery and right greater saphenous leg vein.;  Surgeon: Ivin Poot, MD;  Location: [place];  Service: Open Heart Surgery;  Laterality: N/A;  . ESOPHAGOGASTRODUODENOSCOPY N/A 08/24/2014   Procedure: ESOPHAGOGASTRODUODENOSCOPY (EGD);  Surgeon: Jerene Bears, MD;  Location: [facility];  Service: Endoscopy;  Laterality: N/A;  . LEFT HEART CATH AND CORONARY ANGIOGRAPHY N/A 01/20/2017   Procedure: Left Heart Cath and Coronary Angiography;  Surgeon: Sherren Mocha, MD;  Location: [place] [facility];  Service: Cardiovascular;  Laterality: N/A;  . TEE WITHOUT CARDIOVERSION N/A 01/22/2017   Procedure: TRANSESOPHAGEAL ECHOCARDIOGRAM (TEE);  Surgeon: Ivin Poot, MD;  Location: [place];  Service: Open Heart Surgery;  Laterality: N/A;     Current Meds  Medication Sig  . aspirin EC 81 MG tablet Take 1 tablet (81 mg total) by mouth daily.  [name] atorvastatin (LIPITOR)  80 MG tablet Take 1 tablet (80 mg total) by mouth daily.  . Cholecalciferol (VITAMIN D3) 1.25 MG (50000 UT) CAPS Take 2 capsules by mouth daily.  . metoprolol succinate (TOPROL-XL) 25 MG 24 hr tablet Take 1 tablet (25 mg total) by mouth daily. immediately following a meal. Schedule an office visit  . Omega 3 1200 MG CAPS Take by mouth.  . tadalafil (CIALIS) 5 MG tablet TAKE 1 TABLET EVERY 3 DAYS AS  NEEDED. DO NOT TAKE NITROGLYCERIN FOR 3 DAYS AFTER CIALIS DOSE.     Allergies:   Ibuprofen   Social History   Tobacco Use  . Smoking status: Never Smoker  . Smokeless tobacco: Never Used  Substance Use Topics  . Alcohol use: No  . Drug use: No     Family Hx: The patient's family history includes Diabetes in his father; Esophageal cancer in his paternal grandmother; Heart disease in his brother, father, and sister; Liver cancer in his maternal grandmother.  ROS:   Please see the history of present illness.   All other systems reviewed and are negative.   Prior CV studies:   The following studies were reviewed today:  Echo 02/2017 Study Conclusions  - Procedure narrative: Transthoracic echocardiography for left   ventricular function evaluation, for right ventricular function   evaluation, and for assessment of valvular function. Image   quality was adequate. Intravenous contrast (Definity) was   administered to enhance regional wall motion assessment and   opacify the LV. - Left ventricle: The cavity size was normal. Wall thickness was   normal. Systolic function was normal. The estimated ejection   fraction was in the range of 50% to 55%. Left ventricular   diastolic function parameters were normal. - Aortic valve: Trileaflet. Sclerosis without stenosis. There was   no regurgitation. - Left atrium: The atrium was normal in size. - Atrial septum: Aneurysmal IAS - cannot r/o small PFO. - Inferior vena cava: The vessel was normal in size. The   respirophasic diameter changes were in the normal range (>= 50%),   consistent with normal central venous pressure.  Impressions:  - Technically difficult study. Defnity contrast given. LVEF 50-55%,   mild inferior hypokinesis, normal diastolic function, normal   biatrial size, aneurysmal IAS - cannot r/o small PFO, normal IVC.  Labs/Other Tests and Data Reviewed:    EKG:  No ECG reviewed.  Recent Labs: 10/12/2018: ALT  20; BUN 23; Creatinine, Ser 1.06; Hemoglobin 15.2; Platelets 130.0; Potassium 4.8; Sodium 139; TSH 1.17   Recent Lipid Panel Lab Results  Component Value Date/Time   CHOL 134 10/12/2018 08:06 AM   CHOL 127 03/31/2018 08:28 AM   TRIG 44.0 10/12/2018 08:06 AM   HDL 44.40 10/12/2018 08:06 AM   HDL 51 03/31/2018 08:28 AM   CHOLHDL 3 10/12/2018 08:06 AM   LDLCALC 81 10/12/2018 08:06 AM   LDLCALC 69 03/31/2018 08:28 AM   LDLDIRECT 161.3 10/03/2008 09:26 AM    Wt Readings from Last 3 Encounters:  04/21/19 229 lb (103.9 kg)  10/28/18 242 lb (109.8 kg)  04/23/18 237 lb 12.8 oz (107.9 kg)     Objective:    Vital Signs:  Pulse 75   Ht 5\' 10"  (1.778 m)   Wt 229 lb (103.9 kg)   BMI 32.86 kg/m    VITAL SIGNS:  reviewed GEN:  no acute distress EYES:  sclerae anicteric, EOMI - Extraocular Movements Intact RESPIRATORY:  normal respiratory effort, symmetric expansion CARDIOVASCULAR:  no peripheral edema SKIN:  no rash, lesions or ulcers. MUSCULOSKELETAL:  no obvious deformities. NEURO:  alert and oriented x 3, no obvious focal deficit PSYCH:  normal affect  ASSESSMENT & PLAN:    1. CAD s/p CABG -Continue ASA, statin and BB.  Continue exercise regimen.   2. HLD - 10/12/2018: Cholesterol 134; HDL 44.40; LDL Cholesterol 81; Triglycerides 44.0; VLDL 8.8  - Continue high intensity statin.  COVID-19 Education: The signs and symptoms of COVID-19 were discussed with the patient and how to seek care for testing (follow up with PCP or arrange E-visit). The importance of social distancing was discussed today.  Time:   Today, I have spent 8 minutes with the patient with telehealth technology discussing the above problems.     Medication Adjustments/Labs and Tests Ordered: Current medicines are reviewed at length with the patient today.  Concerns regarding medicines are outlined above.   Tests Ordered: No orders of the defined types were placed in this encounter.   Medication Changes:  No orders of the defined types were placed in this encounter.   Disposition:  Follow up in 1 year(s)  Signed, Leanor Kail, PA  04/21/2019 8:27 AM    [facility]

## 2019-04-21 ENCOUNTER — Telehealth (INDEPENDENT_AMBULATORY_CARE_PROVIDER_SITE_OTHER): Payer: BLUE CROSS/BLUE SHIELD | Admitting: Physician Assistant

## 2019-04-21 ENCOUNTER — Encounter: Payer: Self-pay | Admitting: Physician Assistant

## 2019-04-21 ENCOUNTER — Other Ambulatory Visit: Payer: Self-pay

## 2019-04-21 VITALS — HR 75 | Ht 70.0 in | Wt 229.0 lb

## 2019-04-21 DIAGNOSIS — Z7189 Other specified counseling: Secondary | ICD-10-CM

## 2019-04-21 DIAGNOSIS — E785 Hyperlipidemia, unspecified: Secondary | ICD-10-CM

## 2019-04-21 DIAGNOSIS — I25700 Atherosclerosis of coronary artery bypass graft(s), unspecified, with unstable angina pectoris: Secondary | ICD-10-CM | POA: Diagnosis not present

## 2019-04-21 NOTE — Patient Instructions (Signed)
Medication Instructions:  Your physician recommends that you continue on your current medications as directed. Please refer to the Current Medication list given to you today.  If you need a refill on your cardiac medications before your next appointment, please call your pharmacy.   Lab work: NONE If you have labs (blood work) drawn today and your tests are completely normal, you will receive your results only by: [name] MyChart Message (if you have MyChart) OR . A paper copy in the mail If you have any lab test that is abnormal or we need to change your treatment, we will call you to review the results.  Testing/Procedures: NONE  Follow-Up: At [facility], you and your health needs are our priority.  As part of our continuing mission to provide you with exceptional heart care, we have created designated Provider Care Teams.  These Care Teams include your primary Cardiologist (physician) and Advanced Practice Providers (APPs -  Physician Assistants and Nurse Practitioners) who all work together to provide you with the care you need, when you need it. You will need a follow up appointment in:  12 months.  Please call our office 2 months in advance to schedule this appointment.  You may see DR. Burt Knack or one of the following Advanced Practice Providers on your designated Care Team: Richardson Dopp, PA-C Vin [place], [place] . Daune Perch, NP

## 2019-04-26 ENCOUNTER — Ambulatory Visit: Payer: BLUE CROSS/BLUE SHIELD | Admitting: Internal Medicine

## 2019-04-26 DIAGNOSIS — G4733 Obstructive sleep apnea (adult) (pediatric): Secondary | ICD-10-CM | POA: Diagnosis not present

## 2019-05-05 ENCOUNTER — Other Ambulatory Visit: Payer: Self-pay | Admitting: Internal Medicine

## 2019-05-10 DIAGNOSIS — L821 Other seborrheic keratosis: Secondary | ICD-10-CM | POA: Diagnosis not present

## 2019-05-10 DIAGNOSIS — L814 Other melanin hyperpigmentation: Secondary | ICD-10-CM | POA: Diagnosis not present

## 2019-05-10 DIAGNOSIS — D1801 Hemangioma of skin and subcutaneous tissue: Secondary | ICD-10-CM | POA: Diagnosis not present

## 2019-05-19 ENCOUNTER — Other Ambulatory Visit: Payer: Self-pay | Admitting: Cardiovascular Disease

## 2019-07-27 DIAGNOSIS — G4733 Obstructive sleep apnea (adult) (pediatric): Secondary | ICD-10-CM | POA: Diagnosis not present

## 2019-08-19 ENCOUNTER — Ambulatory Visit: Payer: BLUE CROSS/BLUE SHIELD | Admitting: Internal Medicine

## 2019-09-01 ENCOUNTER — Encounter: Payer: Self-pay | Admitting: Internal Medicine

## 2019-09-02 ENCOUNTER — Telehealth: Payer: Self-pay | Admitting: Internal Medicine

## 2019-09-02 NOTE — Telephone Encounter (Signed)
Did he answer yes to any other screening questions??  LMTCB for pt TCB so he can be screened

## 2019-09-05 ENCOUNTER — Other Ambulatory Visit: Payer: Self-pay

## 2019-09-05 ENCOUNTER — Encounter: Payer: Self-pay | Admitting: Internal Medicine

## 2019-09-05 ENCOUNTER — Other Ambulatory Visit: Payer: Self-pay | Admitting: Cardiovascular Disease

## 2019-09-05 ENCOUNTER — Ambulatory Visit (INDEPENDENT_AMBULATORY_CARE_PROVIDER_SITE_OTHER): Payer: BC Managed Care – PPO | Admitting: Internal Medicine

## 2019-09-05 VITALS — BP 136/84 | HR 59 | Temp 97.4°F | Ht 70.0 in | Wt 240.6 lb

## 2019-09-05 DIAGNOSIS — I2510 Atherosclerotic heart disease of native coronary artery without angina pectoris: Secondary | ICD-10-CM

## 2019-09-05 DIAGNOSIS — Z23 Encounter for immunization: Secondary | ICD-10-CM | POA: Diagnosis not present

## 2019-09-05 DIAGNOSIS — G4733 Obstructive sleep apnea (adult) (pediatric): Secondary | ICD-10-CM

## 2019-09-05 NOTE — Progress Notes (Signed)
HPI male never smoker followed for OSA, complicated by  duodenal ulcer, UGI bleed, GERD, MI/CAD/CABG x4 HST-09/01/17-AHI 61.5/hour, desaturation to 74%, body weight 224 pounds  -----------------------------------------------------------------------------------  04/23/2018- 63 year old male never smoker followed for OSA, complicated by  duodenal ulcer, UGI bleed, GERD, MI/CAD/CABG x4 CPAP auto 5-20/[employer] ----OSA: DME: [employer]. Pt wears CPAP nightly for 8 hours. Pressure works well. DL attached.  Download 100% compliance AHI 5.0/hour. He is doing very well, sleeping well and feeling better with CPAP.  Using a So-Clean machine, small nasal mask.  No specific concerns.  09/05/2019- 63 year old male never smoker followed for OSA, complicated by  duodenal ulcer, UGI bleed, GERD, MI/CAD/CABG x4 CPAP auto 5-20/[employer] -----OSA on CPAP 5-20, DME: [employer]; pt reports nasal irritation from mask (went to dermatologist for), inquiring about alternative masks Body weight today 240 lbs Download compliance 100%, AHI 3.4/ hr Has moved to [place] and in process of moving medical care. Discussed working with an [place] branch there as he changes physicians. Dermatitis on nose from contact with mask. Discussed change to nasal pillows. Interested in travel CPAP machine- discussed. Would like to lower pressure.  ROS-see HPI   + = positive Constitutional:    weight loss, night sweats, fevers, chills, fatigue, lassitude. HEENT:    headaches, difficulty swallowing, tooth/dental problems, sore throat,       sneezing, itching, ear ache, nasal congestion, post nasal drip, snoring CV:    chest pain, orthopnea, PND, swelling in lower extremities, anasarca,                                                        dizziness, palpitations Resp:   shortness of breath with exertion or at rest.                productive cough,   non-productive cough, coughing up of blood.              change in color of mucus.  wheezing.   Skin:     rash or lesions. GI:  No-   heartburn, indigestion, abdominal pain, nausea, vomiting, diarrhea,                 change in bowel habits, loss of appetite GU: dysuria, change in color of urine, no urgency or frequency.   flank pain. MS:   joint pain, stiffness, decreased range of motion, back pain. Neuro-     nothing unusual Psych:  change in mood or affect.  depression or anxiety.   memory loss.  OBJ- Physical Exam General- Alert, Oriented, Affect-appropriate, Distress- none acute, + Overweight Skin- +erythema skin of nose Lymphadenopathy- none Head- atraumatic            Eyes- Gross vision intact, PERRLA, conjunctivae and secretions clear            Ears- Hearing, canals-normal            Nose-  Septal dev +,  No-mucus, polyps, erosion, perforation  + nose whistle            Throat- Mallampati II-III , mucosa clear , drainage- none, tonsils- atrophic Neck- flexible , trachea midline, no stridor , thyroid nl, carotid no bruit Chest - symmetrical excursion , unlabored           Heart/CV- RRR , no murmur , no gallop  ,  no rub, nl s1 s2                           - JVD- none , edema- none, stasis changes- none, varices- none           Lung- clear to P&A, wheeze- none, cough- none , dullness-none, rub- none           Chest wall-  Abd-  Br/ Gen/ Rectal- Not done, not indicated Extrem- cyanosis- none, clubbing, none, atrophy- none, strength- nl Neuro- grossly intact to observation

## 2019-09-05 NOTE — Assessment & Plan Note (Signed)
Good compliance and control. He would like lower pressure and there is some room as discussed. Plan- change auto range to 5-15, print script for travel machine, suggest nasal pillows to reduce skin irritation, discussed transfer to [facility] branch of [facility]

## 2019-09-05 NOTE — Telephone Encounter (Signed)
Attempted to call patient, no answer, left message to call back and to not come to the office if he is exhibiting any covid symptoms.

## 2019-09-05 NOTE — Patient Instructions (Signed)
Order- DME [facility], Please change autopap range to 5-15, continue mask of choice, humidifier, supplies, Airview/ card  Order- flu vaccine standard  Order- print script for CPAP machine of choice , auto 5-15, humidifier, supplies, mask of choice, download card      Common travel machines: Transcend, AirMini  Suggest you talk with your DME company about trying a nasal pillows mask style to reduce skin irritation on your nose

## 2019-09-05 NOTE — Assessment & Plan Note (Signed)
Denies recent problems. Following with cardiology.

## 2019-09-05 NOTE — Assessment & Plan Note (Signed)
He is carrying more weight than advisable for his OSA and cardiac health.  Weight loss encouraged

## 2019-09-06 NOTE — Telephone Encounter (Signed)
Pt seen in office on 09/05/2019. Nothing further needed.

## 2019-10-06 ENCOUNTER — Other Ambulatory Visit: Payer: Self-pay | Admitting: Cardiology

## 2019-10-06 NOTE — Telephone Encounter (Signed)
Pt's pharmacy is requesting a refill on tadalafil. Would Dr. Cooper like to refill this medication? Please address 

## 2019-10-14 DIAGNOSIS — H2513 Age-related nuclear cataract, bilateral: Secondary | ICD-10-CM | POA: Diagnosis not present

## 2019-10-24 ENCOUNTER — Encounter: Payer: Self-pay | Admitting: Gastroenterology

## 2019-10-27 DIAGNOSIS — G4733 Obstructive sleep apnea (adult) (pediatric): Secondary | ICD-10-CM | POA: Diagnosis not present

## 2019-11-02 ENCOUNTER — Encounter: Payer: BLUE CROSS/BLUE SHIELD | Admitting: Internal Medicine

## 2019-12-07 ENCOUNTER — Other Ambulatory Visit: Payer: Self-pay | Admitting: Cardiovascular Disease

## 2019-12-07 NOTE — Telephone Encounter (Signed)
Pt's pharmacy is requesting a refill on tadalafil. Please address

## 2019-12-22 ENCOUNTER — Other Ambulatory Visit: Payer: Self-pay | Admitting: Internal Medicine

## 2019-12-22 NOTE — Telephone Encounter (Signed)
Please refill as per office routine med refill policy (all routine meds refilled for 3 mo or monthly per pt preference up to one year from last visit, then month to month grace period for 3 mo, then further med refills will have to be denied)  

## 2019-12-26 ENCOUNTER — Telehealth: Payer: Self-pay | Admitting: Internal Medicine

## 2019-12-26 NOTE — Telephone Encounter (Signed)
Ok for 90 day refill x 1 only

## 2019-12-26 NOTE — Telephone Encounter (Signed)
Pt has not see MD since 2019... pls advise on refill.Marland KitchenJohny Rose

## 2019-12-26 NOTE — Telephone Encounter (Signed)
     Patient moved  to [place]. Requesting 1 more refill until he gets established with new PCP   1. Which medications need to be refilled? (please list name of each medication and dose if known) atorvastatin (LIPITOR) 80 MG tablet  2. Which pharmacy/location (including street and city if local pharmacy) is medication to be sent to? [facility] [place], [place] , PHONE [phone]  3. Do they need a 30 day or 90 day supply? [place]

## 2019-12-27 ENCOUNTER — Other Ambulatory Visit: Payer: Self-pay

## 2019-12-27 MED ORDER — ATORVASTATIN CALCIUM 80 MG PO TABS: 80.0000 mg | ORAL_TABLET | Freq: Every day | ORAL | 1 refills | Status: AC

## 2019-12-27 NOTE — Telephone Encounter (Signed)
Sent refill to requested pharmacy.  Patient notified

## 2020-01-10 DIAGNOSIS — R635 Abnormal weight gain: Secondary | ICD-10-CM | POA: Diagnosis not present

## 2020-01-19 DIAGNOSIS — Z125 Encounter for screening for malignant neoplasm of prostate: Secondary | ICD-10-CM | POA: Diagnosis not present

## 2020-01-19 DIAGNOSIS — Z6835 Body mass index (BMI) 35.0-35.9, adult: Secondary | ICD-10-CM | POA: Diagnosis not present

## 2020-01-19 DIAGNOSIS — I2510 Atherosclerotic heart disease of native coronary artery without angina pectoris: Secondary | ICD-10-CM | POA: Diagnosis not present

## 2020-01-19 DIAGNOSIS — R739 Hyperglycemia, unspecified: Secondary | ICD-10-CM | POA: Diagnosis not present

## 2020-01-19 DIAGNOSIS — Z0000 Encounter for general adult medical examination without abnormal findings: Secondary | ICD-10-CM | POA: Diagnosis not present

## 2020-01-26 DIAGNOSIS — G4733 Obstructive sleep apnea (adult) (pediatric): Secondary | ICD-10-CM | POA: Diagnosis not present

## 2020-02-01 DIAGNOSIS — Z951 Presence of aortocoronary bypass graft: Secondary | ICD-10-CM | POA: Diagnosis not present

## 2020-02-01 DIAGNOSIS — Z6835 Body mass index (BMI) 35.0-35.9, adult: Secondary | ICD-10-CM | POA: Diagnosis not present

## 2020-02-01 DIAGNOSIS — I2510 Atherosclerotic heart disease of native coronary artery without angina pectoris: Secondary | ICD-10-CM | POA: Diagnosis not present

## 2020-02-01 DIAGNOSIS — Z86010 Personal history of colonic polyps: Secondary | ICD-10-CM | POA: Diagnosis not present

## 2020-02-21 DIAGNOSIS — M1712 Unilateral primary osteoarthritis, left knee: Secondary | ICD-10-CM | POA: Diagnosis not present

## 2020-02-29 DIAGNOSIS — Z951 Presence of aortocoronary bypass graft: Secondary | ICD-10-CM | POA: Diagnosis not present

## 2020-02-29 DIAGNOSIS — G4733 Obstructive sleep apnea (adult) (pediatric): Secondary | ICD-10-CM | POA: Diagnosis not present

## 2020-02-29 DIAGNOSIS — I83893 Varicose veins of bilateral lower extremities with other complications: Secondary | ICD-10-CM | POA: Diagnosis not present

## 2020-02-29 DIAGNOSIS — I2510 Atherosclerotic heart disease of native coronary artery without angina pectoris: Secondary | ICD-10-CM | POA: Diagnosis not present

## 2020-02-29 DIAGNOSIS — M25562 Pain in left knee: Secondary | ICD-10-CM | POA: Diagnosis not present

## 2020-03-07 DIAGNOSIS — M25562 Pain in left knee: Secondary | ICD-10-CM | POA: Diagnosis not present

## 2020-03-13 DIAGNOSIS — K648 Other hemorrhoids: Secondary | ICD-10-CM | POA: Diagnosis not present

## 2020-03-13 DIAGNOSIS — Z1211 Encounter for screening for malignant neoplasm of colon: Secondary | ICD-10-CM | POA: Diagnosis not present

## 2020-03-13 DIAGNOSIS — Z9119 Patient's noncompliance with other medical treatment and regimen: Secondary | ICD-10-CM | POA: Diagnosis not present

## 2020-03-13 DIAGNOSIS — K621 Rectal polyp: Secondary | ICD-10-CM | POA: Diagnosis not present

## 2020-03-13 DIAGNOSIS — K635 Polyp of colon: Secondary | ICD-10-CM | POA: Diagnosis not present

## 2020-03-13 DIAGNOSIS — Z86010 Personal history of colonic polyps: Secondary | ICD-10-CM | POA: Diagnosis not present

## 2020-03-14 DIAGNOSIS — M25562 Pain in left knee: Secondary | ICD-10-CM | POA: Diagnosis not present

## 2020-03-20 DIAGNOSIS — M25562 Pain in left knee: Secondary | ICD-10-CM | POA: Diagnosis not present

## 2020-03-27 DIAGNOSIS — M25562 Pain in left knee: Secondary | ICD-10-CM | POA: Diagnosis not present

## 2020-04-10 DIAGNOSIS — I2510 Atherosclerotic heart disease of native coronary artery without angina pectoris: Secondary | ICD-10-CM | POA: Diagnosis not present

## 2020-04-10 DIAGNOSIS — R0602 Shortness of breath: Secondary | ICD-10-CM | POA: Diagnosis not present

## 2020-04-10 DIAGNOSIS — R0989 Other specified symptoms and signs involving the circulatory and respiratory systems: Secondary | ICD-10-CM | POA: Diagnosis not present

## 2020-04-11 DIAGNOSIS — M25562 Pain in left knee: Secondary | ICD-10-CM | POA: Diagnosis not present

## 2020-04-17 DIAGNOSIS — M25562 Pain in left knee: Secondary | ICD-10-CM | POA: Diagnosis not present

## 2020-04-17 DIAGNOSIS — Z6834 Body mass index (BMI) 34.0-34.9, adult: Secondary | ICD-10-CM | POA: Diagnosis not present

## 2020-04-17 DIAGNOSIS — I83893 Varicose veins of bilateral lower extremities with other complications: Secondary | ICD-10-CM | POA: Diagnosis not present

## 2020-04-17 DIAGNOSIS — I781 Nevus, non-neoplastic: Secondary | ICD-10-CM | POA: Diagnosis not present

## 2020-04-17 DIAGNOSIS — I2510 Atherosclerotic heart disease of native coronary artery without angina pectoris: Secondary | ICD-10-CM | POA: Diagnosis not present

## 2020-04-25 DIAGNOSIS — M25562 Pain in left knee: Secondary | ICD-10-CM | POA: Diagnosis not present

## 2020-04-27 DIAGNOSIS — G4733 Obstructive sleep apnea (adult) (pediatric): Secondary | ICD-10-CM | POA: Diagnosis not present

## 2020-05-02 DIAGNOSIS — M25562 Pain in left knee: Secondary | ICD-10-CM | POA: Diagnosis not present

## 2020-05-08 DIAGNOSIS — R0602 Shortness of breath: Secondary | ICD-10-CM | POA: Diagnosis not present

## 2020-05-08 DIAGNOSIS — I255 Ischemic cardiomyopathy: Secondary | ICD-10-CM | POA: Diagnosis not present

## 2020-05-08 DIAGNOSIS — Z951 Presence of aortocoronary bypass graft: Secondary | ICD-10-CM | POA: Diagnosis not present

## 2020-05-08 DIAGNOSIS — I2510 Atherosclerotic heart disease of native coronary artery without angina pectoris: Secondary | ICD-10-CM | POA: Diagnosis not present

## 2020-05-15 DIAGNOSIS — R0602 Shortness of breath: Secondary | ICD-10-CM | POA: Diagnosis not present

## 2020-05-15 DIAGNOSIS — Z951 Presence of aortocoronary bypass graft: Secondary | ICD-10-CM | POA: Diagnosis not present

## 2020-05-15 DIAGNOSIS — I2510 Atherosclerotic heart disease of native coronary artery without angina pectoris: Secondary | ICD-10-CM | POA: Diagnosis not present

## 2020-05-15 DIAGNOSIS — I255 Ischemic cardiomyopathy: Secondary | ICD-10-CM | POA: Diagnosis not present

## 2020-05-21 DIAGNOSIS — G4733 Obstructive sleep apnea (adult) (pediatric): Secondary | ICD-10-CM | POA: Diagnosis not present

## 2020-05-21 DIAGNOSIS — I255 Ischemic cardiomyopathy: Secondary | ICD-10-CM | POA: Diagnosis not present

## 2020-05-21 DIAGNOSIS — R0602 Shortness of breath: Secondary | ICD-10-CM | POA: Diagnosis not present

## 2020-05-21 DIAGNOSIS — I10 Essential (primary) hypertension: Secondary | ICD-10-CM | POA: Diagnosis not present

## 2020-05-21 DIAGNOSIS — Z951 Presence of aortocoronary bypass graft: Secondary | ICD-10-CM | POA: Diagnosis not present

## 2020-05-21 DIAGNOSIS — I25728 Atherosclerosis of autologous artery coronary artery bypass graft(s) with other forms of angina pectoris: Secondary | ICD-10-CM | POA: Diagnosis not present

## 2020-05-21 DIAGNOSIS — I25119 Atherosclerotic heart disease of native coronary artery with unspecified angina pectoris: Secondary | ICD-10-CM | POA: Diagnosis not present

## 2020-05-21 DIAGNOSIS — Z7982 Long term (current) use of aspirin: Secondary | ICD-10-CM | POA: Diagnosis not present

## 2020-05-21 DIAGNOSIS — E785 Hyperlipidemia, unspecified: Secondary | ICD-10-CM | POA: Diagnosis not present

## 2020-05-21 DIAGNOSIS — Z79899 Other long term (current) drug therapy: Secondary | ICD-10-CM | POA: Diagnosis not present

## 2020-05-27 DIAGNOSIS — Z6835 Body mass index (BMI) 35.0-35.9, adult: Secondary | ICD-10-CM | POA: Diagnosis not present

## 2020-05-27 DIAGNOSIS — R21 Rash and other nonspecific skin eruption: Secondary | ICD-10-CM | POA: Diagnosis not present

## 2020-05-27 DIAGNOSIS — T7840XA Allergy, unspecified, initial encounter: Secondary | ICD-10-CM | POA: Diagnosis not present

## 2020-05-27 DIAGNOSIS — L299 Pruritus, unspecified: Secondary | ICD-10-CM | POA: Diagnosis not present

## 2020-05-30 DIAGNOSIS — Z951 Presence of aortocoronary bypass graft: Secondary | ICD-10-CM | POA: Diagnosis not present

## 2020-05-30 DIAGNOSIS — L270 Generalized skin eruption due to drugs and medicaments taken internally: Secondary | ICD-10-CM | POA: Diagnosis not present

## 2020-05-30 DIAGNOSIS — Z6836 Body mass index (BMI) 36.0-36.9, adult: Secondary | ICD-10-CM | POA: Diagnosis not present

## 2020-06-09 ENCOUNTER — Other Ambulatory Visit: Payer: Self-pay | Admitting: Cardiovascular Disease

## 2020-06-25 ENCOUNTER — Other Ambulatory Visit: Payer: Self-pay | Admitting: Cardiovascular Disease

## 2020-07-05 ENCOUNTER — Other Ambulatory Visit: Payer: Self-pay | Admitting: Cardiovascular Disease

## 2020-09-04 ENCOUNTER — Ambulatory Visit: Payer: BC Managed Care – PPO | Admitting: Internal Medicine
